# Patient Record
Sex: Male | Born: 1945 | Race: White | Hispanic: No | Marital: Married | Smoking: Never smoker
Health system: Southern US, Community
[De-identification: ages and names within clinical notes are randomized; demographics above are authoritative.]

## PROBLEM LIST (undated history)

## (undated) DIAGNOSIS — I4719 Other supraventricular tachycardia: Secondary | ICD-10-CM

## (undated) DIAGNOSIS — Z972 Presence of dental prosthetic device (complete) (partial): Secondary | ICD-10-CM

## (undated) DIAGNOSIS — Z8619 Personal history of other infectious and parasitic diseases: Secondary | ICD-10-CM

## (undated) DIAGNOSIS — I471 Supraventricular tachycardia: Secondary | ICD-10-CM

## (undated) DIAGNOSIS — R55 Syncope and collapse: Secondary | ICD-10-CM

## (undated) DIAGNOSIS — I4892 Unspecified atrial flutter: Secondary | ICD-10-CM

## (undated) DIAGNOSIS — IMO0002 Reserved for concepts with insufficient information to code with codable children: Secondary | ICD-10-CM

## (undated) DIAGNOSIS — I493 Ventricular premature depolarization: Secondary | ICD-10-CM

## (undated) DIAGNOSIS — N200 Calculus of kidney: Secondary | ICD-10-CM

## (undated) DIAGNOSIS — H5509 Other forms of nystagmus: Secondary | ICD-10-CM

## (undated) HISTORY — DX: Other supraventricular tachycardia: I47.19

## (undated) HISTORY — DX: Reserved for concepts with insufficient information to code with codable children: IMO0002

## (undated) HISTORY — DX: Unspecified atrial flutter: I48.92

## (undated) HISTORY — DX: Calculus of kidney: N20.0

## (undated) HISTORY — PX: NEPHROSTOMY: SHX1014

## (undated) HISTORY — DX: Personal history of other infectious and parasitic diseases: Z86.19

## (undated) HISTORY — DX: Syncope and collapse: R55

## (undated) HISTORY — DX: Ventricular premature depolarization: I49.3

## (undated) HISTORY — PX: CARDIAC ELECTROPHYSIOLOGY STUDY AND ABLATION: SHX1294

## (undated) HISTORY — DX: Supraventricular tachycardia: I47.1

## (undated) HISTORY — PX: SQUAMOUS CELL CARCINOMA EXCISION: SHX2433

## (undated) HISTORY — PX: HERNIA REPAIR: SHX51

## (undated) HISTORY — DX: Other forms of nystagmus: H55.09

---

## 2005-07-04 ENCOUNTER — Ambulatory Visit: Payer: Self-pay | Admitting: Family Medicine

## 2010-08-30 ENCOUNTER — Ambulatory Visit: Payer: Self-pay | Admitting: Family Medicine

## 2010-09-24 ENCOUNTER — Ambulatory Visit: Payer: Self-pay | Admitting: Unknown Physician Specialty

## 2010-12-13 ENCOUNTER — Observation Stay (HOSPITAL_COMMUNITY)
Admission: EM | Admit: 2010-12-13 | Discharge: 2010-12-15 | Disposition: A | Discharge status: Home / Self Care | Payer: Medicare Other | Source: Ambulatory Visit | Attending: Internal Medicine | Admitting: Internal Medicine

## 2010-12-13 ENCOUNTER — Emergency Department (HOSPITAL_COMMUNITY): Payer: Medicare Other

## 2010-12-13 DIAGNOSIS — R5381 Other malaise: Secondary | ICD-10-CM | POA: Insufficient documentation

## 2010-12-13 DIAGNOSIS — R61 Generalized hyperhidrosis: Secondary | ICD-10-CM | POA: Insufficient documentation

## 2010-12-13 DIAGNOSIS — I10 Essential (primary) hypertension: Secondary | ICD-10-CM | POA: Insufficient documentation

## 2010-12-13 DIAGNOSIS — R55 Syncope and collapse: Principal | ICD-10-CM | POA: Insufficient documentation

## 2010-12-13 DIAGNOSIS — R002 Palpitations: Secondary | ICD-10-CM | POA: Insufficient documentation

## 2010-12-13 DIAGNOSIS — R079 Chest pain, unspecified: Secondary | ICD-10-CM | POA: Insufficient documentation

## 2010-12-13 DIAGNOSIS — H538 Other visual disturbances: Secondary | ICD-10-CM | POA: Insufficient documentation

## 2010-12-13 LAB — COMPREHENSIVE METABOLIC PANEL
AST: 28 U/L (ref 0–37)
Albumin: 4.3 g/dL (ref 3.5–5.2)
Alkaline Phosphatase: 82 U/L (ref 39–117)
CO2: 31 mEq/L (ref 19–32)
Chloride: 105 mEq/L (ref 96–112)
Creatinine, Ser: 1.04 mg/dL (ref 0.4–1.5)
GFR calc Af Amer: >60 mL/min (ref >60)
GFR calc non Af Amer: >60 mL/min (ref >60)
Potassium: 4.7 mEq/L (ref 3.5–5.1)
Total Bilirubin: 0.4 mg/dL (ref 0.3–1.2)

## 2010-12-13 LAB — CBC
Hemoglobin: 15.1 g/dL (ref 13.0–17.0)
MCH: 30.3 pg (ref 26.0–34.0)
MCV: 92 fL (ref 78.0–100.0)
Platelets: 206 10*3/uL (ref 150–400)
RBC: 4.99 MIL/uL (ref 4.22–5.81)
WBC: 9.8 10*3/uL (ref 4.0–10.5)

## 2010-12-13 LAB — DIFFERENTIAL
Basophils Relative: 0 % (ref 0–1)
Lymphs Abs: 3.5 10*3/uL (ref 0.7–4.0)
Monocytes Relative: 9 % (ref 3–12)
Neutro Abs: 5.3 10*3/uL (ref 1.7–7.7)
Neutrophils Relative %: 54 % (ref 43–77)

## 2010-12-13 LAB — POCT CARDIAC MARKERS: Troponin i, poc: <0.05 ng/mL (ref 0.00–0.09)

## 2010-12-14 ENCOUNTER — Emergency Department (HOSPITAL_COMMUNITY): Payer: Medicare Other

## 2010-12-14 DIAGNOSIS — R55 Syncope and collapse: Secondary | ICD-10-CM

## 2010-12-14 LAB — LIPID PANEL
Total CHOL/HDL Ratio: 4.9 RATIO
VLDL: 24 mg/dL (ref 0–40)

## 2010-12-14 LAB — URINALYSIS, ROUTINE W REFLEX MICROSCOPIC
Bilirubin Urine: NEGATIVE
Hgb urine dipstick: NEGATIVE
Ketones, ur: NEGATIVE mg/dL
Nitrite: NEGATIVE
Protein, ur: NEGATIVE mg/dL
Specific Gravity, Urine: 1.012 (ref 1.005–1.030)
Urobilinogen, UA: 0.2 mg/dL (ref 0.0–1.0)

## 2010-12-14 LAB — TROPONIN I: Troponin I: <0.01 ng/mL (ref 0.00–0.06)

## 2010-12-14 LAB — CARDIAC PANEL(CRET KIN+CKTOT+MB+TROPI)
CK, MB: 2 ng/mL (ref 0.3–4.0)
CK, MB: 1.9 ng/mL (ref 0.3–4.0)
Relative Index: 1.4 (ref 0.0–2.5)
Total CK: 132 U/L (ref 7–232)
Troponin I: 0.01 ng/mL (ref 0.00–0.06)

## 2010-12-14 LAB — CK TOTAL AND CKMB (NOT AT ARMC)
CK, MB: 1.9 ng/mL (ref 0.3–4.0)
Relative Index: 1.7 (ref 0.0–2.5)
Total CK: 113 U/L (ref 7–232)

## 2010-12-14 LAB — TSH: TSH: 2.552 u[IU]/mL (ref 0.350–4.500)

## 2010-12-14 LAB — T4: T4, Total: 4.2 ug/dL — ABNORMAL LOW (ref 5.0–12.5)

## 2010-12-14 LAB — T3 UPTAKE: T3 Uptake Ratio: 37.3 % — ABNORMAL HIGH (ref 22.5–37.0)

## 2010-12-14 NOTE — H&P (Signed)
NAME:  Billy Carr, CORPENING NO.:  [id number]  MEDICAL RECORD NO.:  [id number]           PATIENT TYPE:  LOCATION:                                 FACILITY:  PHYSICIAN:  Talmage Nap, MD  DATE OF BIRTH:  Jul 18, 1946  DATE OF ADMISSION:  12/14/2010 DATE OF DISCHARGE:                             HISTORY & PHYSICAL   PRIMARY CARE PHYSICIAN:  Assigned.  History obtainable from the patient and the patient's spouse.  CHIEF COMPLAINT: "I Almost passed out in church on Sunday".  The patient is a 65 year old Caucasian male with prior history of shingles and kidney stone status post nephrostomy presenting to the emergency room with history of almost passing out in church on Sunday. The patient claimed that his problems date back only to over a month ago when he complained to his PCP that he was having some precordial discomfort.  There was, however, no associated diaphoresis.  No nausea, no vomiting, no fever, no chills, no rigor, no radiation of the precordial discomfort.  No PND or orthopnea.  The patient saw his primary care doctor and BP check was found to be elevated 156/90 from his normal blood pressure, which is usually 115/85.  The patient subsequently had echocardiogram done.  He also complained about on and off blurry vision and a followup ENT evaluation was again unremarkable. However, on Sunday while the patient was singing in church, he felt very sweaty, diaphoretic, and feeling of being unwell and was almost about to pass out and subsequently had to sit down.  He denied any history of chest pain at that time.  He denied any nausea or vomiting.  Symptom was said to have been transient and subsequently resolved.  The patient, however, came to the hospital on the advice of his PCP to be fully evaluated.  At the time the patient was seen by me, he presently denies any symptoms.  No chest pain or shortness of breath and after full evaluation, he was advised to  be admitted for further workup.  PAST MEDICAL HISTORY:  Positive for kidney stones and shingles.  PAST SURGICAL HISTORY: 1. Left kidney stone status post open nephrostomy. 2. Bilateral hernia repair.  PREADMISSION MEDICATIONS:  Aspirin, dose unknown.  ALLERGIES:  He has no known allergies.  SOCIAL HISTORY:  Negative for alcohol or tobacco use.  He works as an Theatre stage manager.  FAMILY HISTORY:  Negative for any coronary artery disease.  REVIEW OF SYSTEMS:  The patient denies any headaches.  No blurred vision.  No nausea or vomiting.  No fever.  No chills or rigor.  No chest pain or shortness of breath.  No abdominal discomfort.  No diarrhea or hematochezia.  No dysuria or hematuria.  No swelling of the lower extremities.  No intolerance to heat or cold.  No neuropsychiatric disorder.  PHYSICAL EXAMINATION:  GENERAL:  Very pleasant elderly man not in any obvious respiratory distress, well hydrated. VITAL SIGNS:  Blood pressure is 111/60, pulse 60, respiratory rate 14, temperature is 97.9. HEENT:  Pupils are reactive to light and extraocular muscles are intact. NECK:  No jugular venous distention.  No carotid bruit.  No lymphadenopathy. CHEST:  Clear to auscultation. CARDIAC:  Heart sounds are 1 and 2. ABDOMEN:  Soft and nontender.  Liver, spleen and kidneys are not palpable.  Bowel sounds are positive. EXTREMITIES:  No pedal edema. NEUROLOGIC:  Nonfocal. MUSCULOSKELETAL SYSTEM:  Unremarkable. SKIN:  Normal turgor.  LABORATORY DATA:  Initial chemistry showed sodium of 142, potassium of 4.7, chloride of 105 with a bicarb of 51, glucose is 91, BUN is 16, creatinine is 1.04.  LFT normal.  Hematology indices showed WBC of 9.2, hemoglobin of 15.1, hematocrit of 45.9, MCV of 92.0 with a platelet count of 206, normal differential.  First set of cardiac markers troponin-I less than 0.05.  EKG showed normal sinus rhythm with a rate of 60.  No ST-wave changes noted.  Chest x-ray,  the study done include chest x-ray which was essentially normal.  IMPRESSION: 1. Near syncope. 2. History of shingles.  PLAN:  Admit the patient to telemetry.  The patient will be on aspirin 325 mg p.o. daily and he will also be given Protonix 40 mg p.o. daily for GI prophylaxis and Lovenox 40 mg subcu q.24 for DVT prophylaxis. Further labs to be ordered on this patient will include cardiac enzymes q.6 x3, lipid panel as well as thyroid panel which include TSH, T3, and T4.  Imaging studies to be done will include CT of the brain without contrast, carotid duplex, and 2-D echo.  The patient will be reevaluated with lab results.     Talmage Nap, MD     CN/MEDQ  D:  12/14/2010  T:  12/14/2010  Job:  [id number]  Electronically Signed by Talmage Nap  on 12/14/2010 12:08:00 PM

## 2010-12-15 ENCOUNTER — Telehealth: Payer: Self-pay | Admitting: Cardiovascular Disease

## 2010-12-15 ENCOUNTER — Observation Stay (HOSPITAL_COMMUNITY): Payer: Medicare Other

## 2010-12-15 LAB — CBC
HCT: 42.8 % (ref 39.0–52.0)
MCHC: 32 g/dL (ref 30.0–36.0)
MCV: 90.3 fL (ref 78.0–100.0)

## 2010-12-15 LAB — BASIC METABOLIC PANEL
BUN: 17 mg/dL (ref 6–23)
Creatinine, Ser: 1.11 mg/dL (ref 0.4–1.5)
GFR calc non Af Amer: >60 mL/min (ref >60)
Glucose, Bld: 101 mg/dL — ABNORMAL HIGH (ref 70–99)
Potassium: 4.3 mEq/L (ref 3.5–5.1)

## 2010-12-15 LAB — GLUCOSE, CAPILLARY: Glucose-Capillary: 94 mg/dL (ref 70–99)

## 2010-12-28 NOTE — Progress Notes (Signed)
Summary: Needs Exercise Myoview and Event Monitor set up  Phone Note Other Incoming   Caller: Ward Givens, PA Summary of Call: Pt needs a 5 week post hospital appt with Dr Graciela Husbands, appt made for 01/17/11, he will make pt aware of appt date and time.  Pt also needs an exercise Myoview at [facility] for syncope, pt weighs 110.9kg, and needs to be set up with a [employer] monitor ASAP, 30 day event monitor.  Unsure of pt's insurance please call pt to obtain [phone].  Pt is being sent home for [facility] today.  Initial call taken by: Cloyde Reams RN,  December 15, 2010 2:29 PM  Follow-up for Phone Call        30 Day [employer] event monitor ordered 12/16/10. Scheduled gated myoview for 12/29/10 @ [facility]. Called pt with no response, need to obtain his medlist and give appt date/time and instructions for myoview. Follow-up by: Lanny Hurst RN,  December 17, 2010 4:34 PM  Additional Follow-up for Phone Call Additional follow up Details #1::        Spoke to pt, notified him of gated myoview scheduled  @ [facility] for 12/29/10 @ 0900 and to arrive at 0730. Pt does not take any medications. Gave pt instructions for myoview. Told pt event monitor should be en route to his house. Additional Follow-up by: Lanny Hurst RN,  December 20, 2010 12:06 PM  New Problems: SYNCOPE AND COLLAPSE (ICD-780.2)   New Problems: SYNCOPE AND COLLAPSE (ICD-780.2)

## 2010-12-29 ENCOUNTER — Encounter: Payer: Self-pay | Admitting: Cardiovascular Disease

## 2010-12-29 ENCOUNTER — Ambulatory Visit: Payer: Self-pay | Admitting: Cardiovascular Disease

## 2010-12-29 DIAGNOSIS — R55 Syncope and collapse: Secondary | ICD-10-CM

## 2010-12-29 NOTE — Discharge Summary (Signed)
  NAME:  Billy Carr, MROZEK NO.:  [id number]  MEDICAL RECORD NO.:  [id number]           PATIENT TYPE:  LOCATION:                                 FACILITY:  PHYSICIAN:  Thad Ranger, MD       DATE OF BIRTH:  12/03/1945  DATE OF ADMISSION: DATE OF DISCHARGE:                              DISCHARGE SUMMARY   DISCHARGE DIAGNOSES: 1. Recurrent syncope. 2. History of shingles. 3. Palpitations, improved. 4. Hypertension.  CONSULTATIONS:  Cardiology, Dr. Rollene Rotunda.  MEDICATIONS: 1. Aspirin 81 mg p.o. daily. 2. Multivitamin 1 tablet p.o. daily. 3. Vitamin D2 - 50,000 units capsule weekly.  HISTORY OF PRESENT ILLNESS:  Billy Carr is a 65 year old male with history of shingles and kidney stone status post nephrostomy presented to the emergency room with almost passing out in the church on Sunday, 2 days go.  The patient stated that his problem started back only over a month ago, and then he complained to his PCP that he was having some precordial discomfort but no diaphoresis, no nausea, vomiting, fever, chills, or any rigors.  No radiation of the precordial discomfort, no PND or orthopnea.  For details, please refer to admission note dictated by Dr. Talmage Nap on December 14, 2010.  RADIOLOGICAL DATA:  Chest x-ray on December 13, 2010, no acute abnormality. CT head without contrast unremarkable. MRI brain and MRA negative.  MRI did show mild chronic changes in the white matter which likely is due to his chronic ischemia.  No acute abnormality. Echo from the patient's previous cardiologist in [place] showed EF of 50%.  BRIEF HOSPITALIZATION COURSE:  Billy Carr is a 65 year old male who was admitted with recurrent syncopes. 1. Recurrent syncope.  The patient was admitted to the tele monitored     unit.  He did not have any significant arrhythmias while inpatient.     He was started on the aspirin and MRI/MRA done as a part of     recurrent syncope  workup was essentially negative.  Cardiology was     consulted, and the patient was graciously seen by Dr. Antoine Poche.  A     2-D echo was reviewed from his previous physician in [place]     which was essentially normal.  Per Cardiology recommendation, the     patient will be set up for outpatient exercise treadmill test as     well as the event recorder for 30 days.  The patient remained     stable for the hospitalization and will follow up with Dr. Graciela Husbands as     an outpatient which was all set up for the patient prior to the     discharge.  DISCHARGE DIET:  Heart-healthy diet.  Discharge followup with PCP in [place] within 2 weeks and Dr. Graciela Husbands, the appointment will be set up for the patient.     Thad Ranger, MD     RR/MEDQ  D:  12/15/2010  T:  12/16/2010  Job:  [id number]  Electronically Signed by Tamotsu Wiederholt  on 12/29/2010 07:45:38 AM

## 2010-12-30 NOTE — Consult Note (Signed)
NAME:  Billy Carr, Billy Carr NO.:  [id number]  MEDICAL RECORD NO.:  [id number]           PATIENT TYPE:  I  LOCATION:  3705                         FACILITY:  [facility]  PHYSICIAN:  Rollene Rotunda, MD, FACCDATE OF BIRTH:  11/11/1945  DATE OF CONSULTATION:  12/14/2010 DATE OF DISCHARGE:                                CONSULTATION   PRIMARY CARE PHYSICIAN:  Dr. Thayer Ohm.  CARDIOLOGIST:  None.  REASON FOR PRESENTATION:  Evaluate the patient with near syncope.  HISTORY OF PRESENT ILLNESS:  The patient is a very pleasant 65 year old gentleman with no prior cardiac history but a recent workup for some "strong heartbeats."  He says that he has noticed strong heart beats intermittently over about the last month.  He has at times awoken from his sleep because of these.  He cannot bring these on.  He does not describe tachycardic rates.  He had not been having any presyncope or syncope associated with these.  He did see his primary care doctor and was referred to cardiologist.  He had an echocardiogram in [place] which he reports as normal.  He says he wore a monitor which I suspect was a Holter.  He was told that there were some rare tachy palpitations and apparently some isolated bradycardic rates but nothing that required treatment or was felt to be a culprit.  On Sunday while at church, he became warm while standing.  He felt "wobbly".  He felt flushed and apparently became red in the face.  He needed to sit down.  He recovered but then had another less severe but similar episode within a short timeframe.  He never lost consciousness.  He did not feel his heart racing or skipping.  On Monday, he just did not feel well.  He was noted to have an elevated blood pressure when he checked it, 156/90.  This is the second such reading recently.  Because of this constellation of symptoms, he presented to the emergency room.  Here, thus far, enzymes have been negative.  Telemetry has  demonstrated sinus rhythm and some bradycardia, but otherwise was unremarkable.  We are consulted to evaluate this.  Of note, the patient is active.  He has been training for a 10K.  He runs 3 miles at a time and actually apparently did 7 miles recently.  He does not get any chest pressure, neck or arm discomfort.  He does not have any shortness of breath, PND, or orthopnea.  He has had no cough or edema.  PAST MEDICAL HISTORY:  Nephrolithiasis, shingles, psoriasis.  PAST SURGICAL HISTORY:  Nephrostomy, bilateral inguinal herniorrhaphy.  ALLERGIES:  None.  MEDICATIONS:  Aspirin.  SOCIAL HISTORY:  The patient is married.  He has 3 children.  He works in Community education officer.  He has never really smoked cigarettes.  FAMILY HISTORY:  Contributory for a sister having a pacemaker and a valve replacement at the same time in her 55s.  There is no early heart disease, heart failure, or sudden cardiac death.  REVIEW OF SYSTEMS:  Positive for nystagmus.  Otherwise, as stated in the HPI, negative for all other systems.  PHYSICAL EXAMINATION:  GENERAL:  The patient is pleasant and in no distress. VITAL SIGNS:  Blood pressure 122/81, heart rate 59 and regular, afebrile, respiratory rate 18, 93% saturation on room air. HEENT:  Eyelids are unremarkable.  Pupils equal, round, and reactive to light.  Fundi not visualized.  Oral mucosa unremarkable. NECK:  No jugular venous distention at 45 degrees.  Carotid upstroke brisk and symmetrical.  No bruits, no thyromegaly. LYMPHATICS:  No cervical, axillary, inguinal adenopathy. LUNGS:  Clear to auscultation bilaterally. BACK:  No costovertebral angle tenderness. CHEST:  Unremarkable. HEART:  PMI not displaced or sustained, S1 and S2 within normal limits, no S3, no S4, no clicks, no rubs, no murmurs. ABDOMEN:  Flat, positive bowel sounds, normal frequency and pitch, no bruits, no rebound, no guarding, no midline pulsatile mass, no hepatomegaly, no  splenomegaly. SKIN:  No rashes, no nodules. EXTREMITIES:  2+ pulses throughout, no edema, no cyanosis, no clubbing. NEUROLOGIC:  Oriented to person, place, and time.  Cranial nerves II-XII grossly intact, motor grossly intact.  EKG, sinus rhythm, rate 60, axis within normal limits, intervals within normal limits, no acute ST-T wave changes.  TSH 2.552, CK-MB negative x2, LDL 122.  Head CT, negative noncontrast.  Chest x-ray, no acute disease.  ASSESSMENT AND PLAN: 1. Near syncope.  The patient had near syncope but did not actually     lose consciousness.  I suspect that this was vagal.  He has had a     negative workup to date.  We do not need to repeat an     echocardiogram while he is here, but I will get the results from     his physician in [place] to review these.  I would also like to     look at all of the strips on his monitor to make sure there were no     arrhythmias.  I will also order an outpatient exercise treadmill     test.  If this is negative and he has no recurrent symptoms, then     no further cardiac workup would be necessary. 2. Palpitations.  He did have these while wearing a monitor.  There     were no apparent significant dysrhythmias and I will review this.     We will then consider symptomatic treatment if necessary. 3. Hypertension.  His blood pressure has been slightly elevated, but     not since he has been here.  I have advised him to get a blood     pressure cuff, keep a blood pressure diary at home.    Rollene Rotunda, MD, [facility]    JH/MEDQ  D:  12/14/2010  T:  12/15/2010  Job:  [id number]  cc:   Dr. Thayer Ohm.  Electronically Signed by Rollene Rotunda MD [facility] on 12/30/2010 07:58:38 PM

## 2010-12-31 ENCOUNTER — Telehealth: Payer: Self-pay | Admitting: Cardiovascular Disease

## 2011-01-04 NOTE — Progress Notes (Signed)
  Phone Note Other Incoming   Caller: Patient Caller: Dr. Mariah Milling Details for Reason: Myoview Summary of Call: Per Dr. Mariah Milling let patient know Myoview from [facility] looks okay.   Follow-up for Phone Call        notified patient Myoview from [facility] looks okay.  Told patient to continue with follow up appt. with Dr. Graciela Husbands on January 17, 2011. Follow-up by: Bishop Dublin, CMA,  December 31, 2010 10:04 AM

## 2011-01-17 ENCOUNTER — Encounter: Payer: Self-pay | Admitting: Internal Medicine

## 2011-01-17 ENCOUNTER — Ambulatory Visit (INDEPENDENT_AMBULATORY_CARE_PROVIDER_SITE_OTHER): Payer: Medicare Other | Admitting: Internal Medicine

## 2011-01-17 DIAGNOSIS — I471 Supraventricular tachycardia: Secondary | ICD-10-CM | POA: Insufficient documentation

## 2011-01-17 DIAGNOSIS — H5509 Other forms of nystagmus: Secondary | ICD-10-CM | POA: Insufficient documentation

## 2011-01-17 DIAGNOSIS — I498 Other specified cardiac arrhythmias: Secondary | ICD-10-CM

## 2011-01-17 DIAGNOSIS — R55 Syncope and collapse: Secondary | ICD-10-CM | POA: Insufficient documentation

## 2011-01-17 NOTE — Patient Instructions (Signed)
Your physician recommends that you schedule a follow-up appointment in: 3-4 months. 

## 2011-01-17 NOTE — Progress Notes (Signed)
  HPI  Billy Carr is a 65 y.o. male Whom I saw in the hospital at [facility] at the end of February for an episode of syncope. This occurred in hospital and was associated with a prodrome consistent with neurally mediated syncope. He has a Foley had episodes of lightheadedness associated with prolonged standing. I should note that the hospitalized episode was associated vertical nystagmus.  2 essentially discharged with an event recorder which has been negative to date and a stress test which was done by Dr. Knute Neu and demonstrated normal left ventricular function 90% achieved maximal heart rate and no ischemia  Past Medical History  Diagnosis Date  . Nephrolithiasis   . History of shingles     Past Surgical History  Procedure Date  . Nephrostomy     open, left kidney stone  . Hernia repair     bilateral    Current Outpatient Prescriptions  Medication Sig Dispense Refill  . aspirin 81 MG EC tablet Take 81 mg by mouth daily.        . ergocalciferol (VITAMIN D2) 50000 UNITS capsule Take 50,000 Units by mouth once a week.        . Multiple Vitamin (MULTIVITAMIN) tablet Take 1 tablet by mouth daily.          No Known Allergies  Review of Systems negative except from HPI and PMH  Physical Exam Well developed and well nourished Older Caucasian male appearing his stated age in no acute distress HENT normal E scleral and icterus clear Neck Supple JVP flat; carotids brisk and full Clear to ausculation Regular rate and rhythm, no murmurs gallops or rub Soft with active bowel sounds No clubbing cyanosis and edema Alert and oriented, grossly normal motor and sensory function;No evidence of nystagmus Skin Warm and Dry  ECG  Sinus rhythm at 63 Intervals 0.15/2007/0.39 Axis is -55 Assessment and  Plan

## 2011-01-17 NOTE — Assessment & Plan Note (Signed)
No clinical recurrences; his event recorder warned thus far has been unrevealing

## 2011-01-17 NOTE — Assessment & Plan Note (Signed)
The patient has had no recurrent syncope. He has had some orthostatic intolerance. We interviewed maneuvers to try to mitigate this. Also advised and as the importance of hydration especially in the summer months.

## 2011-10-27 ENCOUNTER — Telehealth: Payer: Self-pay | Admitting: *Deleted

## 2011-10-27 ENCOUNTER — Encounter: Payer: Self-pay | Admitting: *Deleted

## 2011-10-27 ENCOUNTER — Encounter: Payer: Self-pay | Admitting: Internal Medicine

## 2011-10-27 ENCOUNTER — Ambulatory Visit (INDEPENDENT_AMBULATORY_CARE_PROVIDER_SITE_OTHER): Payer: Medicare Other | Admitting: Internal Medicine

## 2011-10-27 ENCOUNTER — Ambulatory Visit: Payer: Medicare Other | Admitting: *Deleted

## 2011-10-27 VITALS — BP 130/89 | HR 149 | Ht 72.0 in | Wt 244.0 lb

## 2011-10-27 VITALS — BP 130/89 | HR 149 | Ht 72.0 in | Wt 224.0 lb

## 2011-10-27 DIAGNOSIS — I4892 Unspecified atrial flutter: Secondary | ICD-10-CM | POA: Insufficient documentation

## 2011-10-27 DIAGNOSIS — R002 Palpitations: Secondary | ICD-10-CM

## 2011-10-27 HISTORY — DX: Unspecified atrial flutter: I48.92

## 2011-10-27 NOTE — Telephone Encounter (Signed)
Pt called with c/o past 2 nights unable to get to sleep due to "fluttering sensation in chest," difficult for pt to describe, but this has also woken him up at night x 2, with last night diaphoretic as well. Pt is not diabetic. He denies related SOB or chest pain, but does report "light chest pressure that comes and goes." When he takes a deep breath, "feel the fluttering more intensely." Pt last seen by Dr. Graciela Husbands 01/2011 after ED visit to [facility], he was dx with syncope and atrial tachycardia, 30 day event monitor was unrevealing. Pt states BP usually runs 110-115/70s, unknown HR. He is going to buy a BP cuff today and monitor. He takes no cardiac meds. I have scheduled pt to see Dr. Mariah Milling in clinic in 2 weeks (first available); advised he check BP/HR at next episode and call the office; otherwise if sx become more intense or frequent may come in for nurse visit or see urgent care if after hours. Will forward to Dr. Graciela Husbands for any further rec's in the meantime.

## 2011-10-27 NOTE — Assessment & Plan Note (Signed)
The patient has atrial flutter with 2:1 conduction.  We discussed his thromboembolic risk as well as the potential for risk reduction with catheter ablation as well as a likely diminution in symptoms.  I discussed the fact that atrial flutter is frequently recurring; furthermore that it is not infrequently associated with atrial fibrillation post ablation.  He would like to discuss it with his wife.  We also discussed procedure risks including but not limited to death perforation and heart block requiring pacemaker implantation.  hromboembolic risk

## 2011-10-27 NOTE — Progress Notes (Signed)
See phone note from today; pt called back stating after getting a BP cuff his HR showed 154. Pt in office now, EKG shows possible SVT vs Atrial flutter HR 149. Pt is asymptomatic, other than "just not feeling like myself." Denies CP or SOB. Will discuss EKG with Dr. Graciela Husbands.

## 2011-10-27 NOTE — Progress Notes (Signed)
  HPI  Billy Carr is a 66 y.o. male Seen as an add-on today as he came in because of tachypalpitations. It turns out he was in atrial flutter at 20 and this terminated spontaneously after 2 or 3 days at the time of his ECG.  His thrombo-embolic risk profile is notable only age over 79 for a chadsvasc score of 1   Past Medical History  Diagnosis Date  . Nephrolithiasis   . History of shingles   . Syncope     Neurally mediated  . Atrial tachycardia   . Vertical nystagmus     Past Surgical History  Procedure Date  . Nephrostomy     open, left kidney stone  . Hernia repair     bilateral    Current Outpatient Prescriptions  Medication Sig Dispense Refill  . aspirin 81 MG EC tablet Take 81 mg by mouth daily.        . ergocalciferol (VITAMIN D2) 50000 UNITS capsule Take 50,000 Units by mouth once a week.        . Multiple Vitamin (MULTIVITAMIN) tablet Take 1 tablet by mouth daily.          No Known Allergies  Review of Systems negative except from HPI and PMH  Physical Exam BP 130/89  Pulse 149  Ht 6' (1.829 m)  Wt 224 lb (101.606 kg)  BMI 30.38 kg/m2 Well developed and well nourished in no acute distress HENT normal E scleral and icterus clear Neck Supple JVP flat; carotids brisk and full Clear to ausculation Regular rate and rhythm, no murmurs gallops or rub Soft with active bowel sounds No clubbing cyanosis none Edema Alert and oriented, grossly normal motor and sensory function Skin Warm and Dry  ECG 1 demonstrates atrial flutter-typical with 21 AV conduction Echocardiogram #2 demonstrates sinus rhythm with normal intervals  Assessment and  Plan

## 2011-10-27 NOTE — Telephone Encounter (Signed)
Pt called back stating he bought a BP cuff and his HR is showing 154. I advised pt he come to office now for EKG.

## 2011-11-10 ENCOUNTER — Ambulatory Visit: Payer: Medicare Other | Admitting: Cardiovascular Disease

## 2011-11-23 ENCOUNTER — Ambulatory Visit (INDEPENDENT_AMBULATORY_CARE_PROVIDER_SITE_OTHER): Payer: Medicare Other | Admitting: Internal Medicine

## 2011-11-23 ENCOUNTER — Encounter: Payer: Self-pay | Admitting: Internal Medicine

## 2011-11-23 VITALS — BP 120/80 | HR 73 | Ht 72.0 in | Wt 247.0 lb

## 2011-11-23 DIAGNOSIS — I4892 Unspecified atrial flutter: Secondary | ICD-10-CM

## 2011-11-23 NOTE — Progress Notes (Signed)
Addended by: Festus Aloe on: 11/23/2011 05:23 PM   Modules accepted: Orders

## 2011-11-23 NOTE — Assessment & Plan Note (Signed)
The patient has atrial flutter. I discussed extensively with his wife and him strategies including catheter ablation versus doing nothing. His CHADS-VASc score is sufficiently low that he is no longer taking aspirin. He is having recurrent tachypalpitations on a nightly basis. I do not think these are atrial flutter. In the event that there atrial fibrillation however, I would not pursue catheter ablation of his atrial flutter isthmus. To that end, we'll undertake an event recorder to clarify the mechanism of the tachycardia palpitations

## 2011-11-23 NOTE — Progress Notes (Signed)
skf  HPI  Billy Carr is a 66 y.o. male seen in followup for syncope he also has a history of orthostatic intolerance cells mild atrial tachycardia either of which were clearly related to his syncope..    Last year  an event recorder was negative date and a stress test which was done by Dr. Knute Neu and demonstrated normal left ventricular function 90% achieved maximal heart rate and no ischemia  He was seen last month with tachycardia palpitations and was found to be in atrial flutter terminated spontaneously couple of days afterwards. We discussed extensively catheter ablation even though his CHADS-VASc score was only 1. He comes in today with his wife to review options.  He knows that he has daily tachypalpitations lasting mostly minutes often most notable at night  Past Medical History  Diagnosis Date  . Nephrolithiasis   . History of shingles   . Syncope     Neurally mediated  . Atrial tachycardia   . Vertical nystagmus     Past Surgical History  Procedure Date  . Nephrostomy     open, left kidney stone  . Hernia repair     bilateral    Current Outpatient Prescriptions  Medication Sig Dispense Refill  . aspirin 81 MG EC tablet Take 81 mg by mouth daily.        . ergocalciferol (VITAMIN D2) 50000 UNITS capsule Take 50,000 Units by mouth once a week.        . Multiple Vitamin (MULTIVITAMIN) tablet Take 1 tablet by mouth daily.          No Known Allergies  Review of Systems negative except from HPI and PMH  Physical Exam Well developed and well nourished Older Caucasian male appearing his stated age in no acute distress HENT normal Neck Supple JVP flat; carotids brisk and full Clear to ausculation Regular rate and rhythm, no murmurs gallops or rub Soft with active bowel sounds No clubbing cyanosis and edema Alert and oriented, grossly normal motor and sensory function Skin Warm and Dry  ECG  Sinus rhythm at 71Intervals 0.15/.08/0.39 Axis is -52 Assessment and   Plan

## 2011-11-23 NOTE — Patient Instructions (Signed)
Need to have an event monitor placed.  You will receive the monitor in the mail. We will contact you with the results.

## 2011-12-03 ENCOUNTER — Telehealth: Payer: Self-pay | Admitting: Physician Assistant

## 2011-12-03 NOTE — Telephone Encounter (Signed)
[facility] called to report 90 seconds of atrial fibrillation rates 105-160, self-terminated earlier today. Strips will be sent to our office for Dr. Odessa Fleming review. See OV note re: hx of atrial flutter.  Dakotah Orrego PA-C

## 2012-04-17 ENCOUNTER — Ambulatory Visit (INDEPENDENT_AMBULATORY_CARE_PROVIDER_SITE_OTHER): Payer: Medicare Other | Admitting: Internal Medicine

## 2012-04-17 ENCOUNTER — Encounter: Payer: Self-pay | Admitting: Internal Medicine

## 2012-04-17 VITALS — BP 100/60 | HR 80 | Ht 72.0 in | Wt 221.2 lb

## 2012-04-17 DIAGNOSIS — I493 Ventricular premature depolarization: Secondary | ICD-10-CM

## 2012-04-17 DIAGNOSIS — I4892 Unspecified atrial flutter: Secondary | ICD-10-CM

## 2012-04-17 DIAGNOSIS — I4949 Other premature depolarization: Secondary | ICD-10-CM

## 2012-04-17 DIAGNOSIS — R002 Palpitations: Secondary | ICD-10-CM

## 2012-04-17 NOTE — Progress Notes (Signed)
  HPI  Billy Carr is a 66 y.o. male seen in followup for syncope;  he also has a history of orthostatic intolerance and atrial tachycardia neither of which were clearly related to his syncope..   Last year an event recorder was negative; a stress test which was done by Dr. Knute Neu and demonstrated normal left ventricular function with 90% achieved maximal heart rate and no ischemia  He was seen Feb 2013 with tachy  palpitations and was found to be in atrial flutter which terminated spontaneously couple of days afterwards.  At that time there was a discussion regarding possible ablation; but he was having nocturnal tachy palpitations and an event recorder was ordered>> atrial fib with spells occurring in clusters the longest of which was about an hour and a half with an average heart rate of about 110 at peak heart rates in the 160s.  Most of his symptoms are most of the morning at night. He occasionally waking him at night. I should note that he is not having palpitations today (see below-PVCs) if    Past Medical History  Diagnosis Date  . Nephrolithiasis   . History of shingles   . Syncope     Neurally mediated  . Atrial tachycardia   . Vertical nystagmus   . Atrial flutter 10/27/2011    Past Surgical History  Procedure Date  . Nephrostomy     open, left kidney stone  . Hernia repair     bilateral    Current Outpatient Prescriptions  Medication Sig Dispense Refill  . Multiple Vitamin (MULTIVITAMIN) tablet Take 1 tablet by mouth daily.          No Known Allergies  Review of Systems negative except from HPI and PMH  Physical Exam BP 100/60  Pulse 80  Ht 6' (1.829 m)  Wt 221 lb 4 oz (100.358 kg)  BMI 30.01 kg/m2 Well developed and well nourished in no acute distress HENT normal E scleral and icterus clear Neck Supple JVP flat; carotids brisk and full Clear to ausculation Regular rate and rhythm, no murmurs gallops or rub Soft with active bowel sounds No clubbing  cyanosis none Edema Alert and oriented, grossly normal motor and sensory function Skin Warm and Dry    Assessment and  Plan

## 2012-04-17 NOTE — Assessment & Plan Note (Signed)
He has recurrent symptoms of tachycardia palpitations frequently but not always at night. In fact event recorder identifies multiple episodes occurring during the day. Hence, I think it is unlikely that this is vagal  '  . Given the discombobulating of symptoms associated typically with going to sleep, we will give him a prescription for Inderal take an as-needed basis. He is not inclined currently take antiarrhythmic therapy given the relative infrequency and brief duration of his symptoms and

## 2012-04-17 NOTE — Patient Instructions (Addendum)
   Start inderal 10 mg as needed for palpitations.  Follow up 6 months with Dr. Graciela Husbands.

## 2012-04-18 ENCOUNTER — Telehealth: Payer: Self-pay

## 2012-04-18 ENCOUNTER — Telehealth: Payer: Self-pay | Admitting: Internal Medicine

## 2012-04-18 ENCOUNTER — Other Ambulatory Visit: Payer: Self-pay | Admitting: Internal Medicine

## 2012-04-18 ENCOUNTER — Other Ambulatory Visit: Payer: Self-pay

## 2012-04-18 DIAGNOSIS — I4892 Unspecified atrial flutter: Secondary | ICD-10-CM

## 2012-04-18 DIAGNOSIS — R002 Palpitations: Secondary | ICD-10-CM

## 2012-04-18 MED ORDER — PROPRANOLOL HCL 10 MG PO TABS: 10.0000 mg | ORAL_TABLET | Freq: Three times a day (TID) | ORAL | Status: DC | PRN

## 2012-04-18 NOTE — Telephone Encounter (Signed)
Refill sent for Inderal 10 mg per Dr. Graciela Husbands.

## 2012-04-18 NOTE — Telephone Encounter (Signed)
LHB patient. Will forward to [place].

## 2012-04-18 NOTE — Telephone Encounter (Signed)
Pt was to get Inderal called in to [facility] in [place] and they dont have it can you call pt and let him know when it's called in

## 2012-04-18 NOTE — Telephone Encounter (Signed)
LMOM to have patient check with [facility] pharmacy for the new Rx.

## 2012-04-18 NOTE — Telephone Encounter (Signed)
New Rx sent to [facility] for Inderal 10 mg take as needed for palpitations per Dr. Graciela Husbands.

## 2012-04-22 ENCOUNTER — Emergency Department: Payer: Self-pay | Admitting: *Deleted

## 2012-04-22 LAB — CBC
HCT: 45.3 % (ref 40.0–52.0)
HGB: 14.7 g/dL (ref 13.0–18.0)
MCH: 30.1 pg (ref 26.0–34.0)
MCHC: 32.5 g/dL (ref 32.0–36.0)
WBC: 8.2 10*3/uL (ref 3.8–10.6)

## 2012-04-22 LAB — BASIC METABOLIC PANEL
Anion Gap: 5 — ABNORMAL LOW (ref 7–16)
BUN: 19 mg/dL — ABNORMAL HIGH (ref 7–18)
Calcium, Total: 8.9 mg/dL (ref 8.5–10.1)
EGFR (African American): >60
EGFR (Non-African Amer.): >60
Glucose: 103 mg/dL — ABNORMAL HIGH (ref 65–99)
Osmolality: 284 (ref 275–301)
Potassium: 3.8 mmol/L (ref 3.5–5.1)

## 2012-04-22 LAB — TROPONIN I: Troponin-I: 0.04 ng/mL

## 2012-04-23 ENCOUNTER — Ambulatory Visit (INDEPENDENT_AMBULATORY_CARE_PROVIDER_SITE_OTHER): Payer: Medicare Other | Admitting: Cardiovascular Disease

## 2012-04-23 ENCOUNTER — Encounter: Payer: Self-pay | Admitting: Cardiovascular Disease

## 2012-04-23 VITALS — BP 108/64 | HR 59 | Ht 72.0 in | Wt 221.2 lb

## 2012-04-23 DIAGNOSIS — I4949 Other premature depolarization: Secondary | ICD-10-CM

## 2012-04-23 DIAGNOSIS — I4892 Unspecified atrial flutter: Secondary | ICD-10-CM

## 2012-04-23 DIAGNOSIS — I493 Ventricular premature depolarization: Secondary | ICD-10-CM

## 2012-04-23 NOTE — Patient Instructions (Addendum)
Your physician has requested that you have an echocardiogram. Echocardiography is a painless test that uses sound waves to create images of your heart. It provides your doctor with information about the size and shape of your heart and how well your heart's chambers and valves are working. This procedure takes approximately one hour. There are no restrictions for this procedure.  Start Aspirin 325 mg once daily.   I will discuss with Dr. Graciela Husbands and let you know.

## 2012-04-23 NOTE — Assessment & Plan Note (Signed)
The patient seems to be clearly having more frequent episodes of tachycardia and palpitations based on his description. He appears to be highly symptomatic during these episodes. His baseline heart rate and blood pressure are low and thus it would be difficult to give him a maintenance dose of a beta blocker or calcium channel blocker. I think either need an antiarrhythmic medication or proceeding with an ablation procedure. He has not had any recent echocardiogram and thus I will request one to be done this week to evaluate his LV systolic function and atrial size. He is to continue as needed Inderal. I will be discussing the case with Dr. Graciela Husbands. A class IC antiarrhythmic medication might be reasonable if his echocardiogram does not show any significant structural abnormalities.

## 2012-04-23 NOTE — Progress Notes (Signed)
HPI  Billy Carr is a 66 y.o. male who was added to my schedule today after an emergency room visit at [facility]. He was seen by Dr. Graciela Husbands last week. He has a history of syncope; he also has a history of orthostatic intolerance and atrial tachycardia neither of which were clearly related to his syncope..  Last year an event recorder was negative; a stress test which was done by Dr. Knute Neu and demonstrated normal left ventricular function with 90% achieved maximal heart rate and no ischemia  He was seen Feb 2013 with tachy palpitations and was found to be in atrial flutter which terminated spontaneously couple of days afterwards.  At that time there was a discussion regarding possible ablation; but he was having nocturnal tachy palpitations and an event recorder was ordered>> atrial fib with spells occurring in clusters the longest of which was about an hour and a half with an average heart rate of about 110 at peak heart rates in the 160s.  During last visit, he was started on Inderal to be used as needed for palpitations and tachycardia. On Wednesday, he had frequent episodes with significant relief with Inderal.  He woke up on Sunday morning at 3:30 with palpitations associated with some chest discomfort and dyspnea. He felt his heart was racing. He took one dose of Inderal and went to the emergency room. By the time he arrived to the emergency room he was no longer tachycardic. He asked he had sinus bradycardia with a heart rate of 55 beats per minute. His labs were unremarkable and cardiac enzymes were negative. He was discharged home with a close followup.  No Known Allergies   Current Outpatient Prescriptions on File Prior to Visit  Medication Sig Dispense Refill  . Multiple Vitamin (MULTIVITAMIN) tablet Take 1 tablet by mouth daily.        . propranolol (INDERAL) 10 MG tablet Take 1 tablet (10 mg total) by mouth 3 (three) times daily as needed.  30 tablet  2     Past Medical History    Diagnosis Date  . Nephrolithiasis   . History of shingles   . Syncope     Neurally mediated  . Atrial tachycardia   . Vertical nystagmus   . Atrial flutter/fib 10/27/2011  . PVC (premature ventricular contraction)     asymptomatic     Past Surgical History  Procedure Date  . Nephrostomy     open, left kidney stone  . Hernia repair     bilateral     Family History  Problem Relation Age of Onset  . Heart failure Mother      History   Social History  . Marital Status: Married    Spouse Name: N/A    Number of Children: N/A  . Years of Education: N/A   Occupational History  . Not on file.   Social History Main Topics  . Smoking status: Never Smoker   . Smokeless tobacco: Never Used  . Alcohol Use: No  . Drug Use: No  . Sexually Active:    Other Topics Concern  . Not on file   Social History Narrative  . No narrative on file     PHYSICAL EXAM   BP 108/64  Pulse 59  Ht 6' (1.829 m)  Wt 221 lb 4 oz (100.358 kg)  BMI 30.01 kg/m2 Constitutional: He is oriented to person, place, and time. He appears well-developed and well-nourished. No distress.  HENT: No nasal discharge.  Head: Normocephalic and atraumatic.  Eyes: Pupils are equal and round. Right eye exhibits no discharge. Left eye exhibits no discharge.  Neck: Normal range of motion. Neck supple. No JVD present. No thyromegaly present.  Cardiovascular: Normal rate, regular rhythm, normal heart sounds and. Exam reveals no gallop and no friction rub. No murmur heard.  Pulmonary/Chest: Effort normal and breath sounds normal. No stridor. No respiratory distress. He has no wheezes. He has no rales. He exhibits no tenderness.  Abdominal: Soft. Bowel sounds are normal. He exhibits no distension. There is no tenderness. There is no rebound and no guarding.  Musculoskeletal: Normal range of motion. He exhibits no edema and no tenderness.  Neurological: He is alert and oriented to person, place, and time.  Coordination normal.  Skin: Skin is warm and dry. No rash noted. He is not diaphoretic. No erythema. No pallor.  Psychiatric: He has a normal mood and affect. His behavior is normal. Judgment and thought content normal.       EKG: Sinus bradycardia with PVC. No significant ST or T wave changes. Normal QT interval.   ASSESSMENT AND PLAN

## 2012-04-24 ENCOUNTER — Other Ambulatory Visit: Payer: Self-pay

## 2012-04-24 ENCOUNTER — Other Ambulatory Visit (INDEPENDENT_AMBULATORY_CARE_PROVIDER_SITE_OTHER): Payer: Medicare Other

## 2012-04-24 DIAGNOSIS — I4892 Unspecified atrial flutter: Secondary | ICD-10-CM

## 2012-07-19 ENCOUNTER — Ambulatory Visit: Payer: Self-pay | Admitting: General Surgery

## 2012-07-24 ENCOUNTER — Ambulatory Visit: Payer: Self-pay | Admitting: General Surgery

## 2012-07-24 LAB — CBC WITH DIFFERENTIAL/PLATELET
Basophil %: 0.5 %
Eosinophil #: 0.1 10*3/uL (ref 0.0–0.7)
HCT: 45.7 % (ref 40.0–52.0)
HGB: 15.4 g/dL (ref 13.0–18.0)
Lymphocyte %: 32.5 %
MCH: 30.9 pg (ref 26.0–34.0)
MCHC: 33.6 g/dL (ref 32.0–36.0)
MCV: 92 fL (ref 80–100)
Monocyte #: 0.7 x10 3/mm (ref 0.2–1.0)
Neutrophil #: 4.7 10*3/uL (ref 1.4–6.5)
Neutrophil %: 57.9 %
RBC: 4.98 10*6/uL (ref 4.40–5.90)
RDW: 12.7 % (ref 11.5–14.5)

## 2012-07-24 LAB — BASIC METABOLIC PANEL
Anion Gap: 7 (ref 7–16)
Calcium, Total: 8.7 mg/dL (ref 8.5–10.1)
Co2: 28 mmol/L (ref 21–32)
EGFR (African American): >60
EGFR (Non-African Amer.): >60
Osmolality: 283 (ref 275–301)
Potassium: 4.5 mmol/L (ref 3.5–5.1)
Sodium: 140 mmol/L (ref 136–145)

## 2012-07-25 ENCOUNTER — Ambulatory Visit: Payer: Self-pay | Admitting: General Surgery

## 2012-07-31 ENCOUNTER — Inpatient Hospital Stay: Payer: Self-pay | Admitting: Specialist

## 2012-07-31 DIAGNOSIS — R55 Syncope and collapse: Secondary | ICD-10-CM

## 2012-07-31 LAB — TSH: Thyroid Stimulating Horm: 1.07 u[IU]/mL

## 2012-07-31 LAB — COMPREHENSIVE METABOLIC PANEL
Albumin: 3.5 g/dL (ref 3.4–5.0)
Alkaline Phosphatase: 91 U/L (ref 50–136)
Calcium, Total: 8.5 mg/dL (ref 8.5–10.1)
Co2: 26 mmol/L (ref 21–32)
EGFR (Non-African Amer.): >60
Glucose: 109 mg/dL — ABNORMAL HIGH (ref 65–99)
Osmolality: 284 (ref 275–301)
SGOT(AST): 17 U/L (ref 15–37)
SGPT (ALT): 17 U/L (ref 12–78)
Sodium: 141 mmol/L (ref 136–145)
Total Protein: 7 g/dL (ref 6.4–8.2)

## 2012-07-31 LAB — APTT: Activated PTT: 24 secs (ref 23.6–35.9)

## 2012-07-31 LAB — PROTIME-INR: Prothrombin Time: 13.6 secs (ref 11.5–14.7)

## 2012-07-31 LAB — CK TOTAL AND CKMB (NOT AT ARMC)
CK, Total: 74 U/L (ref 35–232)
CK-MB: 0.8 ng/mL (ref 0.5–3.6)
CK-MB: 1.5 ng/mL (ref 0.5–3.6)

## 2012-07-31 LAB — TROPONIN I
Troponin-I: <0.02 ng/mL
Troponin-I: <0.02 ng/mL

## 2012-07-31 LAB — CBC
HCT: 44.2 % (ref 40.0–52.0)
HGB: 14.7 g/dL (ref 13.0–18.0)
MCH: 30.2 pg (ref 26.0–34.0)
MCV: 91 fL (ref 80–100)
RBC: 4.85 10*6/uL (ref 4.40–5.90)

## 2012-08-01 ENCOUNTER — Other Ambulatory Visit: Payer: Self-pay

## 2012-08-01 DIAGNOSIS — R55 Syncope and collapse: Secondary | ICD-10-CM

## 2012-08-01 DIAGNOSIS — I4892 Unspecified atrial flutter: Secondary | ICD-10-CM

## 2012-08-01 LAB — BASIC METABOLIC PANEL
Anion Gap: 9 (ref 7–16)
Calcium, Total: 8.1 mg/dL — ABNORMAL LOW (ref 8.5–10.1)
EGFR (African American): >60
EGFR (Non-African Amer.): >60
Glucose: 94 mg/dL (ref 65–99)
Osmolality: 286 (ref 275–301)

## 2012-08-01 LAB — CBC WITH DIFFERENTIAL/PLATELET
Basophil #: 0.1 10*3/uL (ref 0.0–0.1)
Eosinophil #: 0.2 10*3/uL (ref 0.0–0.7)
Eosinophil %: 1.5 %
HCT: 40.9 % (ref 40.0–52.0)
HGB: 13.9 g/dL (ref 13.0–18.0)
Lymphocyte %: 20.5 %
MCHC: 34.1 g/dL (ref 32.0–36.0)
MCV: 91 fL (ref 80–100)
Monocyte %: 8.2 %
Neutrophil #: 8.3 10*3/uL — ABNORMAL HIGH (ref 1.4–6.5)
Neutrophil %: 69.3 %
RBC: 4.49 10*6/uL (ref 4.40–5.90)
RDW: 12.4 % (ref 11.5–14.5)
WBC: 12 10*3/uL — ABNORMAL HIGH (ref 3.8–10.6)

## 2012-08-01 LAB — TROPONIN I: Troponin-I: <0.02 ng/mL

## 2012-08-01 LAB — CK TOTAL AND CKMB (NOT AT ARMC): CK, Total: 151 U/L (ref 35–232)

## 2012-08-01 LAB — LIPID PANEL
Cholesterol: 130 mg/dL (ref 0–200)
Ldl Cholesterol, Calc: 81 mg/dL (ref 0–100)

## 2012-08-02 ENCOUNTER — Telehealth: Payer: Self-pay

## 2012-08-02 NOTE — Telephone Encounter (Signed)
Message copied by Marcelle Overlie on Thu Aug 02, 2012  3:46 PM ------      Message from: [place] E      Created: Thu Aug 02, 2012  3:43 PM      Regarding: TCM       F/U 7-14 DAYS WITH KLEIN, IF KLEIN NOT AVAILABLE Mariah Milling

## 2012-08-02 NOTE — Telephone Encounter (Signed)
tcm call

## 2012-08-03 NOTE — Telephone Encounter (Signed)
Pt called back Says he is feeling well, post discharge for atrial fib and syncope He denies symptoms of syncope, near syncope or dizziness He confirms compliance with medication prescribed at d/c He asks what the metoprolol is for. I explained this to him His only complaint is that he was more aware of atrial fib last night while lying in bed. Says it is worse when lying on left side.  I reassured him he is probably in atrial fib all day, just more aware of this at hs, when lying still, etc. Understanding verb.  Denies rapid rate, says rate is controlled Confirms appt with Dr. Kirke Corin 10/29 and will call us if he needs [place] sooner

## 2012-08-03 NOTE — Telephone Encounter (Signed)
LMTCB on cell # [phone]

## 2012-08-07 ENCOUNTER — Encounter: Payer: Self-pay | Admitting: *Deleted

## 2012-08-09 ENCOUNTER — Telehealth: Payer: Self-pay

## 2012-08-09 NOTE — Telephone Encounter (Signed)
TCM Discharged [facility] 08/01/2012. Appoint. [facility] 08/14/2012.

## 2012-08-12 ENCOUNTER — Telehealth: Payer: Self-pay | Admitting: Cardiology

## 2012-08-12 NOTE — Telephone Encounter (Signed)
Received call from Mr. Mctier stating that he was recently discharged from the hospital with a.fib. I do not have access to those records. He reports being on flecainide and metoprolol. This morning he developed palpitations and noted his heart rate to be in the 110s. He did feel weak with this, but otherwise denied chest pain, sob, dizziness, or syncope. He states he took an extra Flecainide dose as was instructed to him and feels somewhat better, but states his heart rate is still elevated. I instructed him that if he continues to feel poorly or his symptoms worsen to seek medical attention. Otherwise he is to take his nighttime doses of flecainide and metoprolol and call the office tomorrow. He stated understanding.  Noel, PA-C 08/12/2012 2:54 PM

## 2012-08-14 ENCOUNTER — Encounter: Payer: Self-pay | Admitting: Cardiovascular Disease

## 2012-08-14 ENCOUNTER — Ambulatory Visit (INDEPENDENT_AMBULATORY_CARE_PROVIDER_SITE_OTHER): Payer: Medicare Other | Admitting: Cardiovascular Disease

## 2012-08-14 VITALS — BP 120/70 | HR 60 | Ht 72.0 in | Wt 214.2 lb

## 2012-08-14 DIAGNOSIS — I4892 Unspecified atrial flutter: Secondary | ICD-10-CM

## 2012-08-14 MED ORDER — FLECAINIDE ACETATE 50 MG PO TABS: 50.0000 mg | ORAL_TABLET | Freq: Two times a day (BID) | ORAL | Status: DC

## 2012-08-14 NOTE — Patient Instructions (Addendum)
Continue same medications.  You can use an extra dose of Flecainide as needed.  Follow up in 3 months.

## 2012-08-17 ENCOUNTER — Encounter: Payer: Self-pay | Admitting: Cardiovascular Disease

## 2012-08-17 NOTE — Assessment & Plan Note (Signed)
He is currently in sinus rhythm. He seems to be doing well on flecainide. He had an echocardiogram done during his hospitalization which showed an ejection fraction of 50-55% with mild to moderate mitral regurgitation. Left atrial size was normal. Continue treatment with flecainide 50 mg twice daily. He can use an additional dose as needed for breakthrough palpitations and tachycardia. The episodes become more frequent, we can increase the dose to 100 mg once daily. Continue anticoagulation with Xarelto. Given that he has both atrial flutter and fibrillation, we'll reserve ablation for failing at least one antiarrhythmic medication.

## 2012-08-17 NOTE — Progress Notes (Signed)
HPI  Billy Carr is a 66 y.o. male who is here today for a followup visit after recent hospitalization at [facility]. He has a history of syncope,  orthostatic intolerance and atrial flutter/fibrillation.   Last year an event recorder was negative; a stress test  demonstrated normal left ventricular function with 90% achieved maximal heart rate and no ischemia  He was seen Feb 2013 with tachy palpitations and was found to be in atrial flutter which terminated spontaneously couple of days afterwards.  At that time there was a discussion regarding possible ablation; but he was having nocturnal tachy palpitations and an event recorder was ordered>> atrial fib with spells occurring in clusters the longest of which was about an hour and a half with an average heart rate of about 110 at peak heart rates in the 160s.  He did not tolerate Inderal due to dizziness.  He presented recently at [facility] after he had 3 syncopal episodes all of them were sudden. He was noted to be in atrial flutter and mildly tachycardic by EMS. He was later noted to have atrial fibrillation on telemetry. It was felt that the syncope might have been related to post termination pulses or 1-1 conduction with atrial flutter. CT head was unremarkable. Carotid duplex showed no significant disease. He converted to sinus rhythm with metoprolol and was started on flecainide 50 mg twice daily as well as Xarelto  for anticoagulation.  he has been doing well since hospital discharge except on Sunday when he forgot to take his flecainide. He developed palpitations and tachycardia. He took an extra dose of flecainide and felt back to his normal self.   No Known Allergies   Current Outpatient Prescriptions on File Prior to Visit  Medication Sig Dispense Refill  . docusate sodium (COLACE) 100 MG capsule Take 100 mg by mouth 3 (three) times daily.      . flecainide (TAMBOCOR) 50 MG tablet Take 1 tablet (50 mg total) by mouth every 12 (twelve) hours.   80 tablet  6  . metoprolol succinate (TOPROL-XL) 12.5 mg TB24 Take by mouth every 12 (twelve) hours.      . Multiple Vitamin (MULTIVITAMIN) tablet Take 1 tablet by mouth daily.        . Rivaroxaban (XARELTO) 20 MG TABS Take 1 tablet (20 mg total) by mouth daily.  30 tablet  0     Past Medical History  Diagnosis Date  . Nephrolithiasis   . History of shingles   . Syncope     Neurally mediated  . Vertical nystagmus   . Atrial tachycardia   . Atrial flutter/fib 10/27/2011  . PVC (premature ventricular contraction)     asymptomatic     Past Surgical History  Procedure Date  . Nephrostomy     open, left kidney stone  . Hernia repair     bilateral     Family History  Problem Relation Age of Onset  . Heart failure Mother      History   Social History  . Marital Status: Married    Spouse Name: N/A    Number of Children: N/A  . Years of Education: N/A   Occupational History  . Not on file.   Social History Main Topics  . Smoking status: Never Smoker   . Smokeless tobacco: Never Used  . Alcohol Use: No  . Drug Use: No  . Sexually Active:    Other Topics Concern  . Not on file   Social  History Narrative  . No narrative on file     PHYSICAL EXAM   BP 120/70  Pulse 60  Ht 6' (1.829 m)  Wt 214 lb 4 oz (97.183 kg)  BMI 29.06 kg/m2 Constitutional: He is oriented to person, place, and time. He appears well-developed and well-nourished. No distress.  HENT: No nasal discharge.  Head: Normocephalic and atraumatic.  Eyes: Pupils are equal and round. Right eye exhibits no discharge. Left eye exhibits no discharge.  Neck: Normal range of motion. Neck supple. No JVD present. No thyromegaly present.  Cardiovascular: Normal rate, regular rhythm, normal heart sounds and. Exam reveals no gallop and no friction rub. No murmur heard.  Pulmonary/Chest: Effort normal and breath sounds normal. No stridor. No respiratory distress. He has no wheezes. He has no rales. He  exhibits no tenderness.  Abdominal: Soft. Bowel sounds are normal. He exhibits no distension. There is no tenderness. There is no rebound and no guarding.  Musculoskeletal: Normal range of motion. He exhibits no edema and no tenderness.  Neurological: He is alert and oriented to person, place, and time. Coordination normal.  Skin: Skin is warm and dry. No rash noted. He is not diaphoretic. No erythema. No pallor.  Psychiatric: He has a normal mood and affect. His behavior is normal. Judgment and thought content normal.       EKG: Sinus  Rhythm  -Left axis.     ABNORMAL   ASSESSMENT AND PLAN

## 2012-09-05 ENCOUNTER — Encounter: Payer: Self-pay | Admitting: Cardiovascular Disease

## 2012-10-05 ENCOUNTER — Other Ambulatory Visit: Payer: Self-pay | Admitting: Internal Medicine

## 2012-10-05 MED ORDER — FLECAINIDE ACETATE 50 MG PO TABS: 50.0000 mg | ORAL_TABLET | Freq: Two times a day (BID) | ORAL | Status: DC

## 2012-10-05 NOTE — Telephone Encounter (Signed)
Refilled Flecainide. 

## 2012-11-27 ENCOUNTER — Ambulatory Visit: Payer: Medicare Other | Admitting: Internal Medicine

## 2012-12-04 ENCOUNTER — Ambulatory Visit (INDEPENDENT_AMBULATORY_CARE_PROVIDER_SITE_OTHER): Payer: Medicare Other | Admitting: Internal Medicine

## 2012-12-04 ENCOUNTER — Encounter: Payer: Self-pay | Admitting: Internal Medicine

## 2012-12-04 VITALS — BP 124/72 | HR 55 | Ht 72.0 in | Wt 220.0 lb

## 2012-12-04 DIAGNOSIS — I4892 Unspecified atrial flutter: Secondary | ICD-10-CM

## 2012-12-04 DIAGNOSIS — R55 Syncope and collapse: Secondary | ICD-10-CM

## 2012-12-04 DIAGNOSIS — R002 Palpitations: Secondary | ICD-10-CM

## 2012-12-04 MED ORDER — FLECAINIDE ACETATE 50 MG PO TABS: 75.0000 mg | ORAL_TABLET | Freq: Two times a day (BID) | ORAL | Status: DC

## 2012-12-04 NOTE — Patient Instructions (Addendum)
Stop the Xarelto. Increase Flecainide to 75 mg take one tablet twice a day. Follow up with Dr. Graciela Husbands in 3 months.

## 2012-12-04 NOTE — Progress Notes (Signed)
Patient Care Team: Vonita Moss, MD as PCP - General (Unknown Physician Specialty)   HPI  Billy Carr is a 67 y.o. male  seen in followup for syncope; he also has a history of orthostatic intolerance and atrial tachycardia/ fibrillation neither of which were clearly related to his syncope.   2012 stress test which was done by Dr. Knute Neu and demonstrated normal left ventricular function with 90% achieved maximal heart rate and no ischemia  He was seen Feb 2013 with tachy palpitations and was found to be in atrial flutter which terminated spontaneously couple of days afterwards.  At that time there was a discussion regarding possible ablation; but he was having nocturnal tachy palpitations and an event recorder was ordered>> atrial fib with spells occurring in clusters the longest of which was about an hour and a half with an average heart rate of about 110 at peak heart rates in the 160s.   It was elected to treat him with prn betablockers as he was averse to the use of antiarrhythmic drugs.  He was hospitalized 9/13 with recurrent syncope.  These spells occurred in a cluster assoc with profound diaphoresis and residual orthostatic intolerance.  He was found to be in afib on arrival to ER ( VS info not available from EMS)   thought ? Related to atrial arrhythmias and flecainide was initiated along with Rivaroxaban.   CHADS-VAS score is one (age)  He  continues to have some spells although briefer and on taking pulse notes irregularly irregular, and has significant LH with this and some dyspnea.    Also spells of dizziness and visual disturbance. These are unrelated to position or prolonged standing  He has not taken his pulse during these spells      Past Medical History  Diagnosis Date  . Nephrolithiasis   . History of shingles   . Syncope     Neurally mediated  . Vertical nystagmus   . Atrial tachycardia   . Atrial flutter/fib 10/27/2011  . PVC (premature ventricular contraction)     asymptomatic    Past Surgical History  Procedure Laterality Date  . Nephrostomy      open, left kidney stone  . Hernia repair      bilateral    Current Outpatient Prescriptions  Medication Sig Dispense Refill  . flecainide (TAMBOCOR) 50 MG tablet Take 1 tablet (50 mg total) by mouth every 12 (twelve) hours.  80 tablet  5  . metoprolol succinate (TOPROL-XL) 12.5 mg TB24 Take by mouth every 12 (twelve) hours.      . Multiple Vitamin (MULTIVITAMIN) tablet Take 1 tablet by mouth daily.        . Rivaroxaban (XARELTO) 20 MG TABS Take 1 tablet (20 mg total) by mouth daily.  30 tablet  0   No current facility-administered medications for this visit.    No Known Allergies  Review of Systems negative except from HPI and PMH  Physical Exam BP 124/72  Pulse 55  Ht 6' (1.829 m)  Wt 220 lb (99.791 kg)  BMI 29.83 kg/m2 Well developed and well nourished in no acute distress HENT normal E scleral and icterus clear Neck Supple JVP flat; carotids brisk and full Clear to ausculation  Regular rate and rhythm, no murmurs gallops or rub Soft with active bowel sounds No clubbing cyanosis none Edema Alert and oriented, grossly normal motor and sensory function Skin Warm and Dry  ECG: Sinus Rhythm 55  Intervals  18/08/43  Axis -76     Assessment and  Plan

## 2012-12-04 NOTE — Assessment & Plan Note (Signed)
Recurrent syncope with protracted symptoms and without comment by EMS suggests vasomotor as opposed to be bradycardia. The identified AFib may have served as the trigger.   I think it is reasonable to be relatively aggressive about trying to prevent afib, and given ongoing symptoms, I have increased his flecainide form 50>>75 bid  The episodes of lightheadedness and visual disturbance are not clearly positional, but may be arrhythmic, perhaps related to short atrial tach/PVCs.  He has had a negative neuro eval in the past    We will initially see if get better with increased flecainide, and if not, with use monitor to see if assoc with arrhtyhmia

## 2012-12-04 NOTE — Assessment & Plan Note (Signed)
On flec and metoprolol  Increased dose as above  Will stop  Rivaroxaban 2/2 low risk score (=1)  Reviewed with pt

## 2013-03-07 ENCOUNTER — Ambulatory Visit (INDEPENDENT_AMBULATORY_CARE_PROVIDER_SITE_OTHER): Payer: Medicare Other | Admitting: Internal Medicine

## 2013-03-07 ENCOUNTER — Encounter: Payer: Self-pay | Admitting: Internal Medicine

## 2013-03-07 VITALS — BP 106/60 | HR 58 | Ht 72.0 in | Wt 220.2 lb

## 2013-03-07 DIAGNOSIS — R55 Syncope and collapse: Secondary | ICD-10-CM

## 2013-03-07 DIAGNOSIS — I4892 Unspecified atrial flutter: Secondary | ICD-10-CM

## 2013-03-07 DIAGNOSIS — I493 Ventricular premature depolarization: Secondary | ICD-10-CM

## 2013-03-07 DIAGNOSIS — I4949 Other premature depolarization: Secondary | ICD-10-CM

## 2013-03-07 NOTE — Assessment & Plan Note (Signed)
Continues to have symptomatic. We have reviewed the physiology and I tried to reassure him that they are benign

## 2013-03-07 NOTE — Progress Notes (Signed)
Patient Care Team: Vonita Moss, MD as PCP - General (Unknown Physician Specialty)   HPI  Billy Carr is a 67 y.o. male Seen in followup for syncope in the context of orthostatic intolerance prior history of atrial tachycardia. He was also found in 2013 atrial flutter and subsequently, on the event recorder, and atrial fibrillation. He was also noted to have symptomatic PVCs. W   He then was started on flecainide and this has done relatively well in keeping in sinus rhythm. He was then started on an adjunctive low-dose metoprolol succinate.  He was at [facility] the other day and noted his heart rate was 47; this was without symptoms.  He continues to have problems with "heart heartbeats"   Past Medical History  Diagnosis Date  . Nephrolithiasis   . History of shingles   . Syncope     Neurally mediated-presumed::also ?assoc with AFib  . Vertical nystagmus   . Atrial tachycardia   . Atrial flutter/fib 10/27/2011    flecainide::CHADSVASc--1>> no anticoag 2/14  . PVC (premature ventricular contraction)     asymptomatic    Past Surgical History  Procedure Laterality Date  . Nephrostomy      open, left kidney stone  . Hernia repair      bilateral    Current Outpatient Prescriptions  Medication Sig Dispense Refill  . flecainide (TAMBOCOR) 50 MG tablet Take 1.5 tablets (75 mg total) by mouth every 12 (twelve) hours.  270 tablet  3  . metoprolol succinate (TOPROL-XL) 12.5 mg TB24 Take by mouth every 12 (twelve) hours.      . Multiple Vitamin (MULTIVITAMIN) tablet Take 1 tablet by mouth daily.         No current facility-administered medications for this visit.    No Known Allergies  Review of Systems negative except from HPI and PMH  Physical Exam BP 106/60  Pulse 58  Ht 6' (1.829 m)  Wt 220 lb 4 oz (99.905 kg)  BMI 29.86 kg/m2 Well developed and nourished in no acute distress HENT normal Neck supple with JVP-flat Clear Regular rate and rhythm, no murmurs or  gallops Abd-soft with active BS No Clubbing cyanosis edema Skin-warm and dry A & Oriented  Grossly normal sensory and motor function  ECG demonstrates sinus rhythm at 58 Intervals 16/10/41  Assessment and  Plan Of a

## 2013-03-07 NOTE — Assessment & Plan Note (Signed)
No recurrent atrial arrhythmias of note

## 2013-03-07 NOTE — Patient Instructions (Addendum)
Your physician wants you to follow-up in: 6 months with Dr. Kirke Corin and 1 year with Dr. Graciela Husbands. You will receive a reminder letter in the mail two months in advance. If you don't receive a letter, please call our office to schedule the follow-up appointment.

## 2013-03-07 NOTE — Assessment & Plan Note (Signed)
No recurrent syncope 

## 2013-03-29 ENCOUNTER — Telehealth: Payer: Self-pay

## 2013-03-29 NOTE — Telephone Encounter (Signed)
Pt states neurologist wants to prescribe him a new medication gabapentin 100 mg 3 times a day, pt wants to check w dr Graciela Husbands to make sure he can take this with the rx's dr Graciela Husbands as prescribed him. Please advise

## 2013-03-29 NOTE — Telephone Encounter (Signed)
Is this ok?

## 2013-04-01 NOTE — Telephone Encounter (Signed)
Pt informed

## 2013-04-01 NOTE — Telephone Encounter (Signed)
yes

## 2013-06-26 ENCOUNTER — Telehealth: Payer: Self-pay | Admitting: *Deleted

## 2013-06-26 NOTE — Telephone Encounter (Signed)
Pt states that a neurologist recently prescribed Baclofen 10 mg bid. He read that one of the side effects of Baclofen is irregular heartbeat. He started Baclofen a month ago and his symptoms started a month ago.  He wanted to let Dr. Graciela Husbands know.

## 2013-06-26 NOTE — Telephone Encounter (Signed)
Patient of Dr. Graciela Husbands. For the past month patients heartbeat feels very irregular and fast. Please advise

## 2013-06-26 NOTE — Telephone Encounter (Signed)
Spoke w/ pt.  He states that the flecainide and metoprolol have been keeping his heart regular, up until about a month ago.   Feels that the "irregularity is getting worse, physically it is draining" his energy and it is skipping beats. Denies any shortness of breath. States that he discussed with Dr. Graciela Husbands some occasional left arm pain that is pretty short lived, aches 3-4 minutes, just goes away.  Reports he had this sensation yesterday, but describes it more as an "ache associated with old age". Would like to discuss with Dr. Graciela Husbands or his nurse.  Explained that Dr. Graciela Husbands is in the [place] office and I would send info to him and let him know.   Please call pt as soon as you can.  Thank you.

## 2013-06-26 NOTE — Telephone Encounter (Signed)
Patient called again and wanting to give you additional info

## 2013-06-27 ENCOUNTER — Telehealth: Payer: Self-pay | Admitting: Internal Medicine

## 2013-06-27 NOTE — Telephone Encounter (Signed)
F/u called [place] office yesterday with some concerns but has had no response.  Pt has afib and the medications he is on do not seem to be working.  Pt states he is feeling "weird" and has been checking pulse for around one month.  HR has not getting regulated and pt is feeling drained.  Pt is also feeling some left arm pain since last night.  Seeing neuro and he prescribed a medication for muscle relaxer.  Please call him to discuss this issue.

## 2013-06-28 ENCOUNTER — Other Ambulatory Visit: Payer: Self-pay | Admitting: *Deleted

## 2013-06-28 DIAGNOSIS — I4891 Unspecified atrial fibrillation: Secondary | ICD-10-CM

## 2013-06-28 NOTE — Telephone Encounter (Signed)
Spoke with patient who states that today he feels better than he has in 2 weeks. We discussed the Baclofen and the side effects he is having. Per Dr. Graciela Husbands he is going to come in for BMET check since he is taking this medication. Advised that if he returns to feeling awful like he did he will not wait weeks/month to call us. If symptoms return/persist he agreed to call back for further instructions.

## 2013-07-01 ENCOUNTER — Telehealth: Payer: Self-pay | Admitting: *Deleted

## 2013-07-01 NOTE — Telephone Encounter (Signed)
Set patient up for BMET check in [place] tomorrow at 2:05pm. Pt agreeable to plan

## 2013-07-02 ENCOUNTER — Ambulatory Visit (INDEPENDENT_AMBULATORY_CARE_PROVIDER_SITE_OTHER): Payer: Medicare Other

## 2013-07-02 DIAGNOSIS — I4891 Unspecified atrial fibrillation: Secondary | ICD-10-CM

## 2013-07-03 LAB — BASIC METABOLIC PANEL
BUN/Creatinine Ratio: 19 (ref 10–22)
CO2: 28 mmol/L (ref 18–29)
Chloride: 100 mmol/L (ref 97–108)
Sodium: 142 mmol/L (ref 134–144)

## 2013-07-10 ENCOUNTER — Telehealth: Payer: Self-pay

## 2013-07-10 NOTE — Telephone Encounter (Signed)
BMET results and education on specific lab results (sodium, glucose, potassium) provided to patient (WNL). Advised that Dr. Graciela Husbands has not reviewed them yet and when he does if there are any changes to be made in his treatment plan, Dr. Odessa Fleming nurse will call him back. Otherwise he is to continue on same treatment plan. Patient verbalized understanding and appreciation of information and call back.

## 2013-07-10 NOTE — Telephone Encounter (Signed)
Pt would like lab results.  

## 2013-08-02 ENCOUNTER — Telehealth: Payer: Self-pay | Admitting: *Deleted

## 2013-08-02 MED ORDER — FLECAINIDE ACETATE 100 MG PO TABS: 100.0000 mg | ORAL_TABLET | Freq: Two times a day (BID) | ORAL | Status: DC

## 2013-08-02 NOTE — Telephone Encounter (Signed)
Patient has been in afib all week and he is going out of town on Monday please advise

## 2013-08-02 NOTE — Telephone Encounter (Addendum)
Called patient back. He feels that he has been back in atrial fib since Monday. Not sure of BP or heart rate. Thinks his rate might be about 100 per minute. Leaving for [place]. Tomorrow and will be back on 10/23. Discussed with Dr.Arida and he advised that the patient increase the Flecainide to 100mg  BID and follow up in this office on 10/24. Patient verbalized understanding.

## 2013-08-09 ENCOUNTER — Ambulatory Visit (INDEPENDENT_AMBULATORY_CARE_PROVIDER_SITE_OTHER): Payer: Medicare Other | Admitting: Cardiovascular Disease

## 2013-08-09 ENCOUNTER — Encounter: Payer: Self-pay | Admitting: Cardiovascular Disease

## 2013-08-09 VITALS — BP 122/68 | HR 57 | Ht 72.0 in | Wt 215.5 lb

## 2013-08-09 DIAGNOSIS — I4892 Unspecified atrial flutter: Secondary | ICD-10-CM

## 2013-08-09 DIAGNOSIS — I4891 Unspecified atrial fibrillation: Secondary | ICD-10-CM

## 2013-08-09 DIAGNOSIS — R079 Chest pain, unspecified: Secondary | ICD-10-CM

## 2013-08-09 MED ORDER — ASPIRIN EC 81 MG PO TBEC: 81.0000 mg | DELAYED_RELEASE_TABLET | Freq: Every day | ORAL | Status: DC

## 2013-08-09 NOTE — Progress Notes (Signed)
HPI  Billy Carr is a 67 y.o. male who is here today for a followup visit regarding syncope,  orthostatic intolerance and atrial flutter/fibrillation.   In 2012,  an event recorder was negative; a stress test  demonstrated normal left ventricular function with 90% achieved maximal heart rate and no ischemia  He was seen Feb 2013 with tachy palpitations and was found to be in atrial flutter which terminated spontaneously couple of days afterwards.  At that time there was a discussion regarding possible ablation; but he was having nocturnal tachy palpitations and an event recorder was ordered>> atrial fib with spells occurring in clusters the longest of which was about an hour and a half with an average heart rate of about 110 at peak heart rates in the 160s.  He did not tolerate Inderal due to dizziness.  He presented in 04/2012 to [facility] after he had 3 syncopal episodes all of them were sudden. He was noted to be in atrial flutter and mildly tachycardic by EMS. He was later noted to have atrial fibrillation on telemetry. It was felt that the syncope might have been related to post termination pulses or 1-1 conduction with atrial flutter. CT head was unremarkable. Carotid duplex showed no significant disease. He converted to sinus rhythm with metoprolol and was started on flecainide 50 mg twice daily as well as Xarelto  for anticoagulation.  Anticoagulation was subsequently stopped due to Low CHADs score. He called our office recently due to increased palpitations. He felt that he was in atrial fibrillation for about a week. We  increased the dose of flecainide to 100 mg twice daily. He has been feeling better since then. No chest pain or dyspnea.  No Known Allergies   Current Outpatient Prescriptions on File Prior to Visit  Medication Sig Dispense Refill  . flecainide (TAMBOCOR) 100 MG tablet Take 1 tablet (100 mg total) by mouth 2 (two) times daily.  60 tablet  3  . metoprolol succinate  (TOPROL-XL) 12.5 mg TB24 Take by mouth every 12 (twelve) hours.      . Multiple Vitamin (MULTIVITAMIN) tablet Take 1 tablet by mouth daily.         No current facility-administered medications on file prior to visit.     Past Medical History  Diagnosis Date  . Nephrolithiasis   . History of shingles   . Syncope     Neurally mediated-presumed::also ?assoc with AFib  . Vertical nystagmus   . Atrial tachycardia   . Atrial flutter/fib 10/27/2011    flecainide::CHADSVASc--1>> no anticoag 2/14  . PVC (premature ventricular contraction)     asymptomatic  . Bulging disc      Past Surgical History  Procedure Laterality Date  . Nephrostomy      open, left kidney stone  . Hernia repair      bilateral     Family History  Problem Relation Age of Onset  . Heart failure Mother      History   Social History  . Marital Status: Married    Spouse Name: N/A    Number of Children: N/A  . Years of Education: N/A   Occupational History  . Not on file.   Social History Main Topics  . Smoking status: Never Smoker   . Smokeless tobacco: Never Used  . Alcohol Use: No  . Drug Use: No  . Sexual Activity: Not on file   Other Topics Concern  . Not on file   Social History  Narrative  . No narrative on file     PHYSICAL EXAM   BP 122/68  Pulse 57  Ht 6' (1.829 m)  Wt 215 lb 8 oz (97.75 kg)  BMI 29.22 kg/m2 Constitutional: He is oriented to person, place, and time. He appears well-developed and well-nourished. No distress.  HENT: No nasal discharge.  Head: Normocephalic and atraumatic.  Eyes: Pupils are equal and round. Right eye exhibits no discharge. Left eye exhibits no discharge.  Neck: Normal range of motion. Neck supple. No JVD present. No thyromegaly present.  Cardiovascular: Normal rate, regular rhythm, normal heart sounds and. Exam reveals no gallop and no friction rub. No murmur heard.  Pulmonary/Chest: Effort normal and breath sounds normal. No stridor. No  respiratory distress. He has no wheezes. He has no rales. He exhibits no tenderness.  Abdominal: Soft. Bowel sounds are normal. He exhibits no distension. There is no tenderness. There is no rebound and no guarding.  Musculoskeletal: Normal range of motion. He exhibits no edema and no tenderness.  Neurological: He is alert and oriented to person, place, and time. Coordination normal.  Skin: Skin is warm and dry. No rash noted. He is not diaphoretic. No erythema. No pallor.  Psychiatric: He has a normal mood and affect. His behavior is normal. Judgment and thought content normal.       EKG:  sinus rhythm with normal PR and QT intervals   ASSESSMENT AND PLAN

## 2013-08-09 NOTE — Assessment & Plan Note (Signed)
He is feeling better after increasing the dose of flecainide 100 mg twice daily. This will be continued at the current dose. I asked him to resume aspirin 81 mg once daily. If he develops recurrent atrial arrhythmia, I recommend considering a different antiarrhythmic medication or referral for catheter ablation.

## 2013-08-09 NOTE — Patient Instructions (Signed)
Resume Aspirin 81 mg once daily.   Your physician wants you to follow-up in: 6 months.  You will receive a reminder letter in the mail two months in advance. If you don't receive a letter, please call our office to schedule the follow-up appointment.

## 2013-08-13 ENCOUNTER — Telehealth: Payer: Self-pay | Admitting: *Deleted

## 2013-08-13 ENCOUNTER — Ambulatory Visit: Payer: Medicare Other | Admitting: Cardiovascular Disease

## 2013-08-13 NOTE — Telephone Encounter (Signed)
Continue Flecainide 100 mg twice daily. Send him a proscription for Metoprolol Tartrate 25 mg to be taken prn for heart rate above 100.

## 2013-08-13 NOTE — Telephone Encounter (Signed)
HR is high running double what it usually does at 108 then back down to 70. Please advise patient

## 2013-08-13 NOTE — Telephone Encounter (Signed)
I spoke with the patient. He saw Dr. Kirke Corin on Friday and was feeling well. He states that Sunday night he was at home and felt like his heart was out of rhythm with HR's around 105-110 bpm. He and his wife are out of town now. He feels like his heart rates are still flucuating, but in the 65-70 range. He does feel like his pulse is irregular now. He denies SOB or increasing rates with activity. I advised I will review with Dr. Kirke Corin and call him back. He is agreeable.

## 2013-08-13 NOTE — Telephone Encounter (Signed)
I spoke with the patient. He is aware of Dr. Jari Sportsman recommendations for PRN metoprolol tartrate. He is currently out of town. He will call us back if he is having problems prior to home on Thursday and we can send a RX to a local pharmacy where is he currently out of town. Otherwise, he will notify the office when returning to town so we can send in his local RX.

## 2013-10-16 ENCOUNTER — Other Ambulatory Visit: Payer: Self-pay | Admitting: *Deleted

## 2013-10-16 ENCOUNTER — Other Ambulatory Visit: Payer: Self-pay

## 2013-10-16 MED ORDER — METOPROLOL SUCCINATE 12.5 MG HALF TABLET: 12.5000 mg | ORAL_TABLET | Freq: Two times a day (BID) | ORAL | Status: DC

## 2013-10-16 NOTE — Telephone Encounter (Signed)
Requested Prescriptions   Signed Prescriptions Disp Refills  . metoprolol succinate (TOPROL-XL) 12.5 mg TB24 24 hr tablet 60 tablet 3    Sig: Take 0.5 tablets (12.5 mg total) by mouth every 12 (twelve) hours.    Authorizing Provider: Lorine Bears A    Ordering User: Kendrick Fries

## 2014-01-28 IMAGING — CT CT ABD-PELV W/O CM
1 of 2 series · 15 of 32 positions shown, 19 images · non-contrast
Comparison: None

REASON FOR EXAM: left inguinal hernia   ORAL CONTRAST ONLY
COMMENTS:

PROCEDURE:     KCT - KCT ABDOMEN/PELVIS WO  - July 19, 2012 [DATE]
RESULT:     Indication: Left inguinal pain
TECHNIQUE: Multiple axial images from the lung bases to the symphysis pubis
were obtained without oral and without intravenous contrast.

[Series 2: abd 3mm wo 3.0 i40f 3 · axial · 0.80mm/px · z∈[-1064,-581]mm · 15 of 175 slices shown, 19 images]
[im 7/175  soft-tissue]
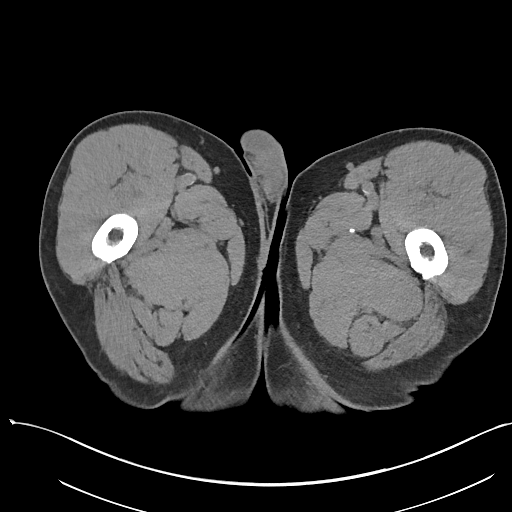
[im 7/175  bone]
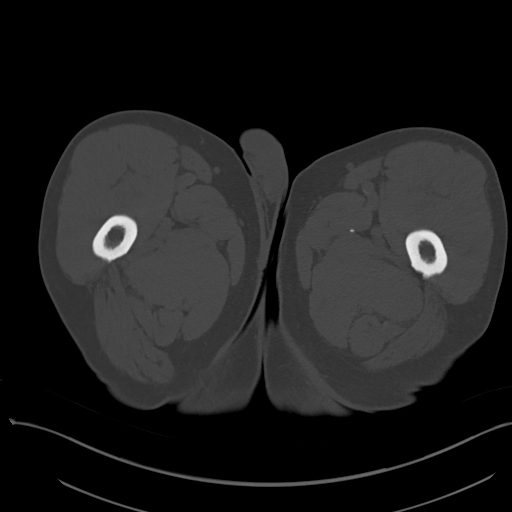
[im 21/175  soft-tissue]
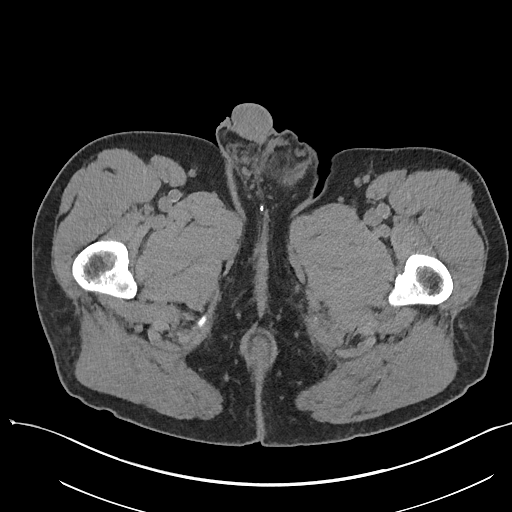
[im 34/175  soft-tissue]
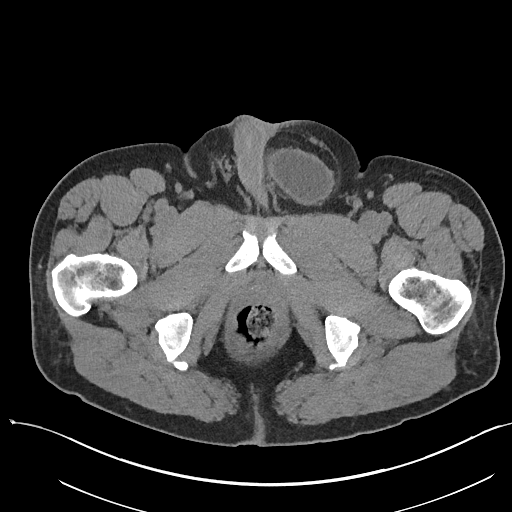
[im 47/175  soft-tissue]
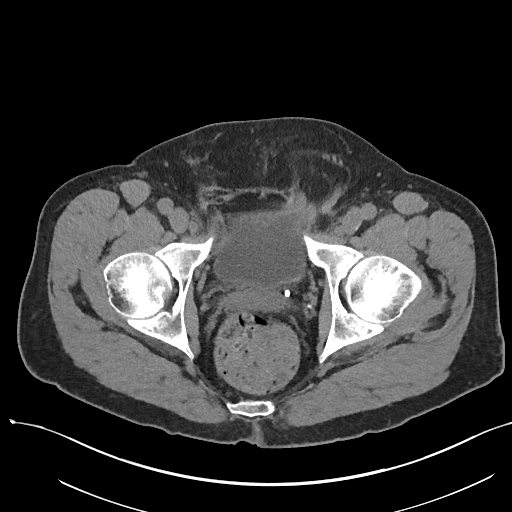
[im 61/175  soft-tissue]
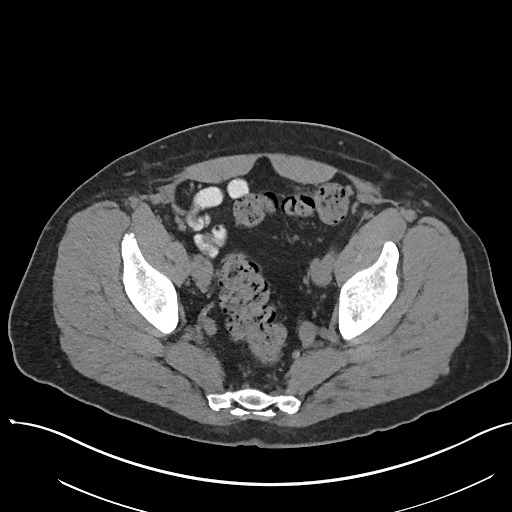
[im 74/175  soft-tissue]
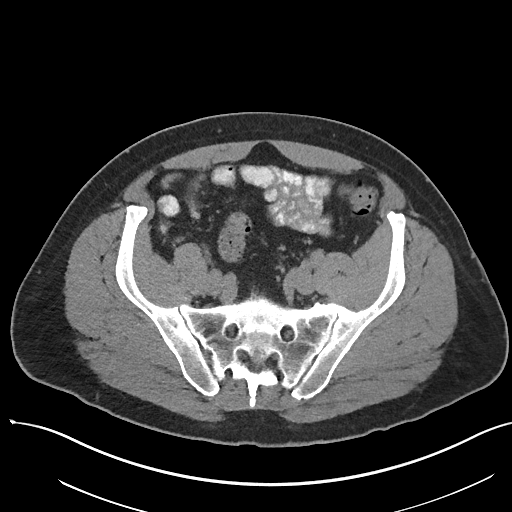
[im 88/175  soft-tissue]
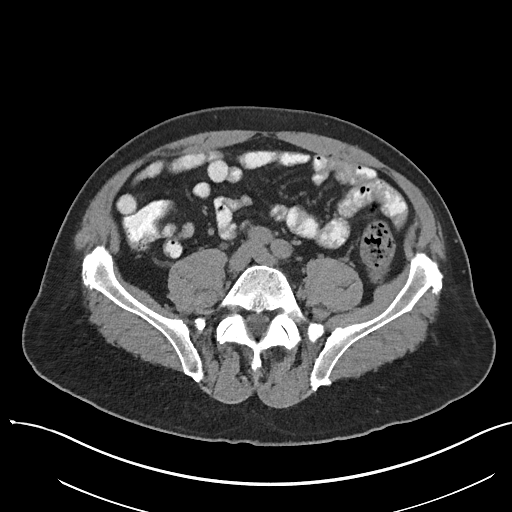
[im 101/175  soft-tissue]
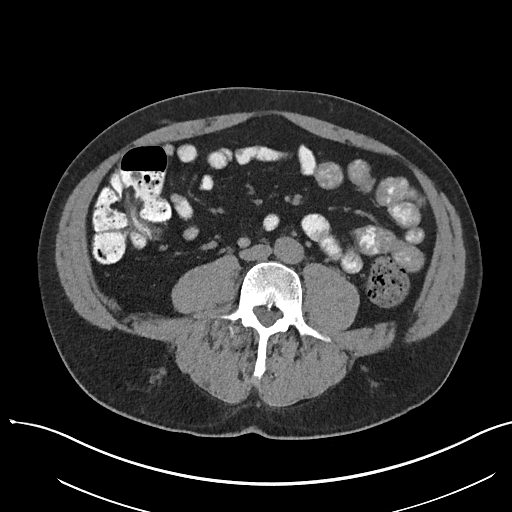
[im 114/175  soft-tissue]
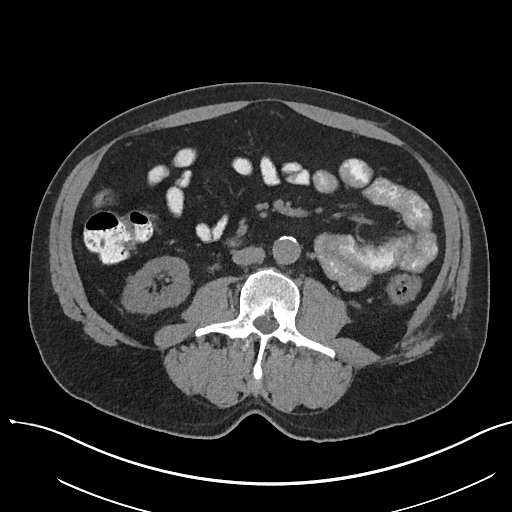
[im 114/175  bone]
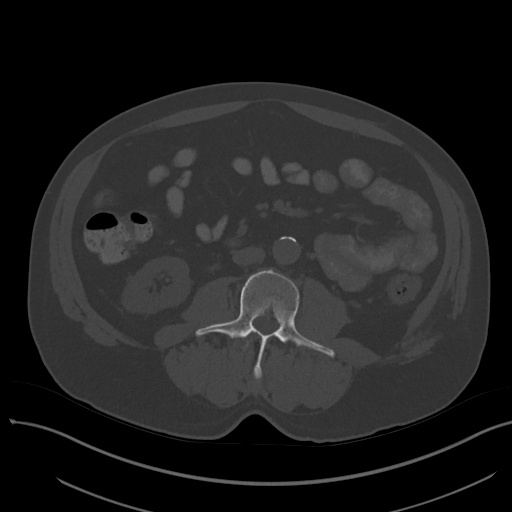
[im 128/175  soft-tissue]
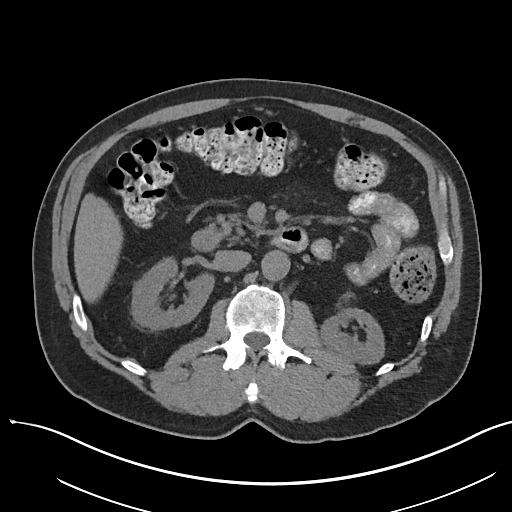
[im 141/175  soft-tissue]
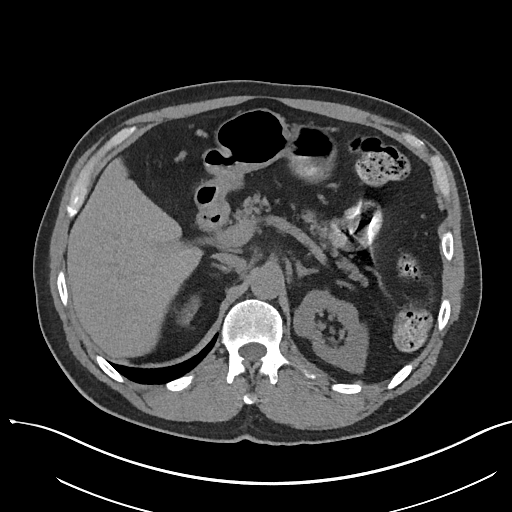
[im 148/175  lung]
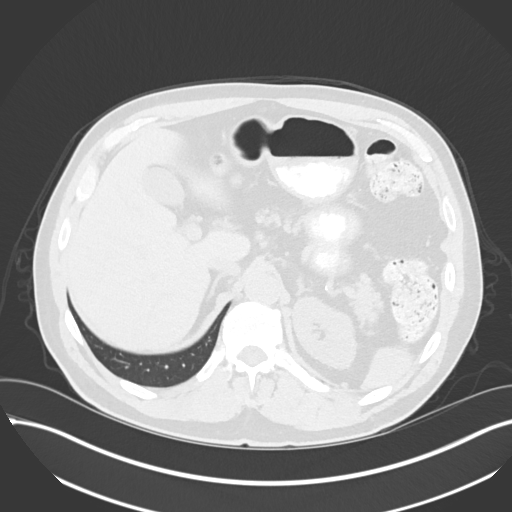
[im 154/175  soft-tissue]
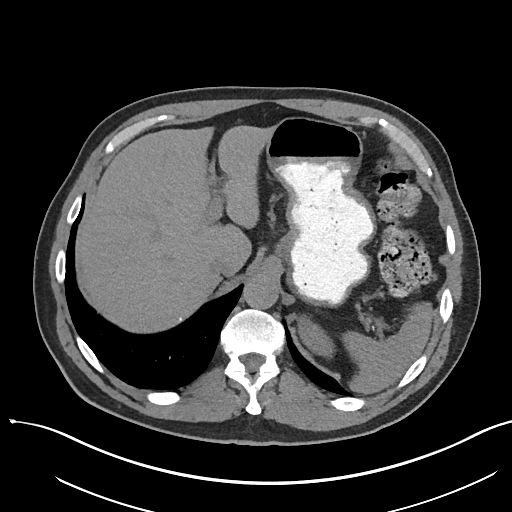
[im 154/175  lung]
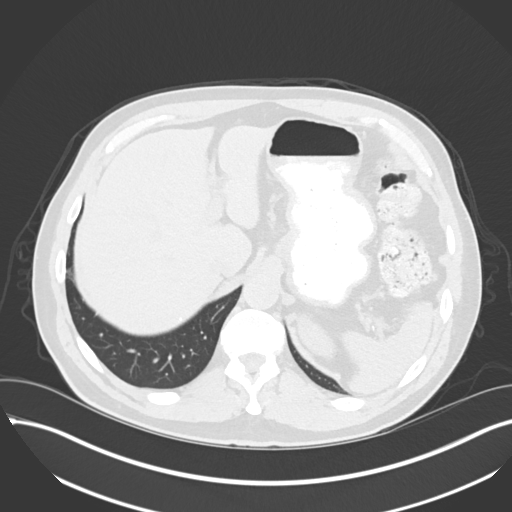
[im 161/175  lung]
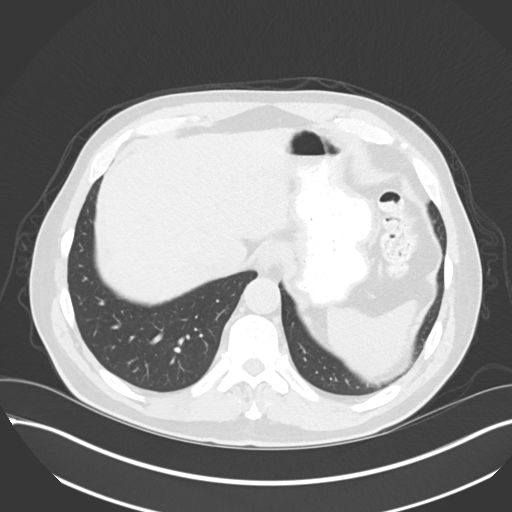
[im 168/175  soft-tissue]
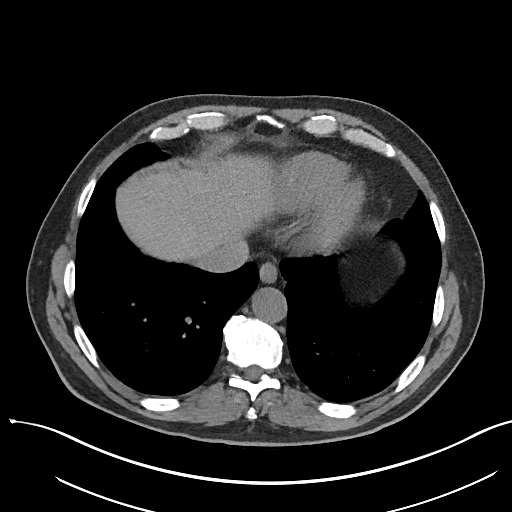
[im 168/175  lung]
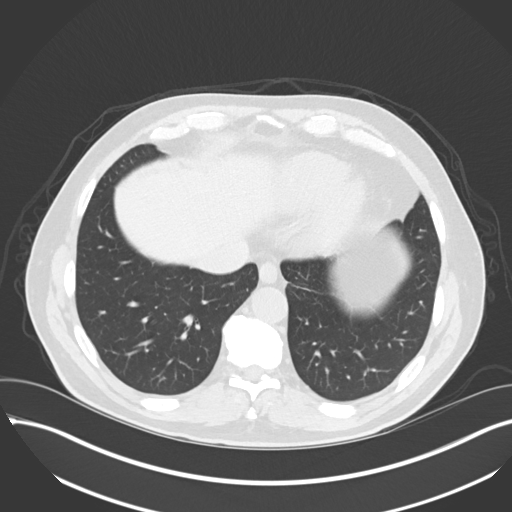

[15 of 32 positions shown; findings below may reference images not displayed]

FINDINGS: The lung bases are clear. There is no pleural or pericardial effusions.

There is nonobstructing left nephrolithiasis. No perinephric stranding is
seen. The kidneys are symmetric in size without evidence for exophytic mass.
There is a left inguinal hernia containing a portion of the bladder.

The liver demonstrates no focal abnormality. The gallbladder is
unremarkable. The spleen demonstrates no focal abnormality. The adrenal
glands and pancreas are normal.

The unopacified stomach, duodenum, small intestine, and large intestine are
unremarkable, but evaluation is limited by lack of oral contrast.  There is
no pneumoperitoneum, pneumatosis, or portal venous gas. There is no
abdominal or pelvic free fluid. There is no lymphadenopathy.

The abdominal aorta is normal in caliber with atherosclerosis.

The osseous structures are unremarkable.
IMPRESSION: 1. There is a left inguinal hernia containing a portion of the bladder.

[REDACTED]

## 2014-04-29 ENCOUNTER — Telehealth: Payer: Self-pay | Admitting: *Deleted

## 2014-04-29 NOTE — Telephone Encounter (Signed)
[facility] with Dr. Alberteen SamJohn Mounsey

## 2014-10-11 ENCOUNTER — Other Ambulatory Visit: Payer: Self-pay | Admitting: Cardiovascular Disease

## 2015-02-03 NOTE — Discharge Summary (Signed)
PATIENT NAME:  Billy Carr, Billy Carr MR#:  [id number] DATE OF BIRTH:  11-17-1945  DATE OF ADMISSION:  07/31/2012 DATE OF DISCHARGE:  08/01/2012  For a detailed note, please take a look at the history and physical done on admission by Dr. Auburn BilberryShreyang Carr.  DISCHARGE DIAGNOSES: 1. Syncope likely secondary to atrial fibrillation/flutter.  2. New onset atrial fibrillation/flutter.   CONSULTANTS:  1. Billy BearsMuhammad Arida, MD - Cardiology. 2. Billy Nordmannimothy Gollan, MD - Cardiology.  PERTINENT STUDIES DURING HOSPITAL COURSE: CT scan of the head done without contrast on admission showed no acute intracranial process.   CT scan of the cervical spine showed no acute osseous injury of the cervical spine.   Ultrasound of the carotids showed atherosclerotic disease without evidence of hemodynamically significant carotid stenosis.   Two-dimensional echocardiogram - results are still pending.  DISCHARGE MEDICATIONS: 1. Multivitamin Centrum daily.  2. Tylenol with oxycodone 5/325 mg one tab every four hours as needed for pain. 3. Colace 100 mg three times daily as needed.  4. Metoprolol tartrate 12.5 mg twice a day. 5. Xarelto 20 mg daily.  6. Flecainide 50 mg twice a day.   DISCHARGE DIET: The patient is being discharged on a low-sodium diet.   DISCHARGE ACTIVITY: As tolerated.   DISCHARGE FOLLOWUP: Follow-up with Dr. Eldridge DaceMuhammed Carr or Dr. Mariah Carr in the next 1 to 2 weeks.  BRIEF HOSPITAL COURSE: This is a 69 year old male who presented to the hospital secondary to recurrent syncopal episodes.  1. Syncope. The most likely cause of the patient's recurrent syncopal episodes was intermittent bursts of atrial fib/flutter. The patient apparently was newly diagnosed with atrial fibrillation/flutter on a Holter monitor as an outpatient and now presented with syncopal episodes. He was not noted to be orthostatic. He had a CT of the head which was negative and a carotid duplex which was also negative. He also had three  sets of cardiac markers checked which were also negative. On telemetry he did have a few bursts of atrial fibrillation and flutter. He was started on low dose beta blocker and actually converted back to a sinus rhythm.  2. New onset atrial fibrillation/flutter. As mentioned, the patient was recently diagnosed with this based on a Holter monitor as an outpatient. He was supposed to follow up with Dr. Graciela Carr for possible ablation, although he had converted to a sinus rhythm here in the hospital after being seen by Dr. Mariah Carr and he recommended starting the patient on some flecainide and some metoprolol and continue him on some Xarelto and have him followup  with them as an outpatient. If he continues to have problems then he may benefit from ablation which can be done at a later point. Currently, for his atrial fibrillation, as mentioned, he was discharged on rate control medications including metoprolol, flecainide, and Xarelto as an anticoagulant.   CODE STATUS: THE PATIENT IS A FULL CODE.  TIME SPENT ON DISCHARGE: 40 minutes.  ____________________________ Billy PancakeVivek Carr. Cherlynn KaiserSainani, MD vjs:slb D: 08/01/2012 13:42:16 ET     T: 08/01/2012 14:25:35 ET       JOB#: [id number] cc: Billy PancakeVivek Carr. Cherlynn KaiserSainani, MD, <Dictator> Muhammad A. Kirke CorinArida, MD Houston SirenVIVEK Carr Aneka Fagerstrom MD ELECTRONICALLY SIGNED 08/02/2012 8:31

## 2015-02-03 NOTE — Consult Note (Signed)
General Aspect Billy Carr is a 69 y.o. male with history of syncope; he also has a history of orthostatic intolerance and atrial arrhythmia.    Last year an event recorder was negative; a stress test demonstrated normal left ventricular function with 90% achieved maximal heart rate and no ischemia .   He was seen Feb 2013 with tachy palpitations and was found to be in atrial flutter which terminated spontaneously couple of days afterwards.   At that time there was a discussion regarding possible ablation; but he was having nocturnal tachy palpitations and an event recorder was ordered which showed atrial fib with spells occurring in clusters the longest of which was about an hour and a half with an average heart rate of about 110 at peak heart rates in the 160s.   he was started on Inderal to be used as needed. He has not been using this medication due to intolerance. He has noticed increased episodes of palpitations. He presented today after he had 3 syncopal episodes. All of them were sudden in with a very brief period EMS were called and he was noted to be in typical atrial flutter at the rate was not very fast. On telemetry he is noted to have intermittent atrial fibrillation. he is very active and exercises on a regular basis without significant limitations. He has no history of hypertension, diabetes or stroke. he underwent a hernia repair surgery last week without complications.   Physical Exam:   GEN no acute distress    NECK supple    RESP normal resp effort  clear BS    CARD Irregular rate and rhythm  No murmur    ABD denies tenderness    LYMPH negative neck    EXTR negative cyanosis/clubbing, negative edema    SKIN normal to palpation    PSYCH alert, A+O to time, place, person   Review of Systems:   Subjective/Chief Complaint syncope    General: No Complaints    Skin: No Complaints    ENT: No Complaints    Eyes: No Complaints    Neck: No Complaints     Respiratory: No Complaints  Short of breath    Cardiovascular: No Complaints    Gastrointestinal: No Complaints    Genitourinary: No Complaints    Neurologic: Fainting    Hematologic: No Complaints    Endocrine: No Complaints    Psychiatric: No Complaints   Lab Results: Thyroid:  15-Oct-13 08:33    Thyroid Stimulating Hormone 1.07 (0.45-4.50 (International Unit)  ----------------------- Pregnant patients have  different reference  ranges for TSH:  - - - - - - - - - -  Pregnant, first trimetser:  0.36 - 2.50 uIU/mL)  Hepatic:  15-Oct-13 08:33    Bilirubin, Total 0.6   Alkaline Phosphatase 91   SGPT (ALT) 17   SGOT (AST) 17   Total Protein, Serum 7.0   Albumin, Serum 3.5  Routine Chem:  15-Oct-13 08:33    Glucose, Serum  109   BUN  19   Creatinine (comp) 0.91   Sodium, Serum 141   Potassium, Serum 4.2   Chloride, Serum 106   CO2, Serum 26   Calcium (Total), Serum 8.5   Osmolality (calc) 284   eGFR (African American) >60   eGFR (Non-African American) >60 (eGFR values <43m/min/1.73 m2 may be an indication of chronic kidney disease (CKD). Calculated eGFR is useful in patients with stable renal function. The eGFR calculation will not be reliable in acutely  ill patients when serum creatinine is changing rapidly. It is not useful in  patients on dialysis. The eGFR calculation may not be applicable to patients at the low and high extremes of body sizes, pregnant women, and vegetarians.)   Anion Gap 9  Cardiac:  15-Oct-13 08:33    Troponin I < 0.02 (0.00-0.05 0.05 ng/mL or less: NEGATIVE  Repeat testing in 3-6 hrs  if clinically indicated. >0.05 ng/mL: POTENTIAL  MYOCARDIAL INJURY. Repeat  testing in 3-6 hrs if  clinically indicated. NOTE: An increase or decrease  of 30% or more on serial  testing suggests a  clinically important change)   CK, Total 74   CPK-MB, Serum 0.8 (Result(s) reported on 31 Jul 2012 at 09:12AM.)    16:25    Troponin I < 0.02  (0.00-0.05 0.05 ng/mL or less: NEGATIVE  Repeat testing in 3-6 hrs  if clinically indicated. >0.05 ng/mL: POTENTIAL  MYOCARDIAL INJURY. Repeat  testing in 3-6 hrs if  clinically indicated. NOTE: An increase or decrease  of 30% or more on serial  testing suggests a  clinically important change)   CK, Total 136   CPK-MB, Serum 1.5 (Result(s) reported on 31 Jul 2012 at 05:08PM.)  Routine Coag:  15-Oct-13 08:33    Activated PTT (APTT) 24.0 (A HCT value >55% may artifactually increase the APTT. In one study, the increase was an average of 19%. Reference: "Effect on Routine and Special Coagulation Testing Values of Citrate Anticoagulant Adjustment in Patients with High HCT Values." American Journal of Clinical Pathology 2006;126:400-405.)   Prothrombin 13.6   INR 1.0 (INR reference interval applies to patients on anticoagulant therapy. A single INR therapeutic range for coumarins is not optimal for all indications; however, the suggested range for most indications is 2.0 - 3.0. Exceptions to the INR Reference Range may include: Prosthetic heart valves, acute myocardial infarction, prevention of myocardial infarction, and combinations of aspirin and anticoagulant. The need for a higher or lower target INR must be assessed individually. Reference: The Pharmacology and Management of the Vitamin K  antagonists: the seventh ACCP Conference on Antithrombotic and Thrombolytic Therapy. DPOEU.2353 Sept:126 (3suppl): [id number]. A HCT value >55% may artifactually increase the PT.  In one study,  the increase was an average of 25%. Reference:  "Effect on Routine and Special Coagulation Testing Values of Citrate Anticoagulant Adjustment in Patients with High HCT Values." American Journal of Clinical Pathology 2006;126:400-405.)  Routine Hem:  15-Oct-13 08:33    WBC (CBC) 8.9   RBC (CBC) 4.85   Hemoglobin (CBC) 14.7   Hematocrit (CBC) 44.2   Platelet Count (CBC) 200 (Result(s) reported on 31 Jul 2012 at 08:53AM.)   MCV 91   MCH 30.2   MCHC 33.2   RDW 12.3   EKG:   Interpretation atrial flutter   Radiology Results: [place]:    15-Oct-13 10:54, US Carotid Doppler Bilateral   US Carotid Doppler Bilateral    REASON FOR EXAM:    syncope  COMMENTS:       PROCEDURE: [place]  - US CAROTID DOPPLER BILATERAL  - Jul 31 2012 10:54AM     RESULT: Carotid Doppler interrogation is performed in the standard   fashion with grayscale, color Doppler and spectral Doppler assessment.   The study demonstrates antegrade flow in both vertebral arteries without   flow reversal. There is some noncalcified plaque in the left carotid bulb   and some minimal plaque an intimal thickening bilaterally. Visually there   is no high degree  of stenosis or significant ulceration. The Doppler   waveforms appear within normal limits. The peak systolic velocities are   normal bilaterally. The internal to common carotid peak systolic velocity   ratio is 0.72 on the right and 0.61 on the left.    IMPRESSION:   1. Atherosclerotic disease without evidence of hemodynamically   significant stenosis.    Dictation Site: 2          Verified By: Sundra Aland, M.D., MD  CT:    15-Oct-13 08:52, CT Head Without Contrast   CT Head Without Contrast    REASON FOR EXAM:    syncope and head injury  COMMENTS:       PROCEDURE: CT  - CT HEAD WITHOUT CONTRAST  - Jul 31 2012  8:52AM     RESULT: Comparison:  None    Technique: Multiple axial images from the foramen magnum to the vertex   were obtained without IV contrast.    Findings:      There is no evidence of mass effect, midline shift, or extra-axial fluid   collections.  There is no evidence of a space-occupying lesion or   intracranial hemorrhage. There is no evidence of a cortical-based area of    acute infarction.      The ventricles and sulci are appropriate for the patient's age. The basal   cisterns are patent.    Visualized portions of the orbits  are unremarkable. The visualized   portions of the paranasal sinuses and mastoid air cells are unremarkable.     The osseous structures are unremarkable.    IMPRESSION:      No acute intracranial process.      Dictation Site: 1          Verified By: Jennette Banker, M.D., MD    No Known Allergies:   Vital Signs/Nurse's Notes: **Vital Signs.:   15-Oct-13 11:00   Vital Signs Type Admission   Temperature Temperature (F) 97.6   Celsius 36.4   Temperature Source Oral   Pulse Pulse 72   Respirations Respirations 20   Systolic BP Systolic BP 086   Diastolic BP (mmHg) Diastolic BP (mmHg) 71   Pulse Lying Pulse Lying 72   Systolic BP Systolic BP 578   Diastolic BP (mmHg) Diastolic BP (mmHg) 70   Pulse Pulse Sitting 73   Systolic BP Systolic BP 469   Diastolic BP (mmHg) Diastolic BP (mmHg) 78   Pulse Standing Pulse Standing 150   Pulse Ox % Pulse Ox % 95   Pulse Ox Activity Level  At rest   Oxygen Delivery Room Air/ 21 %    19:46   Vital Signs Type Routine   Temperature Temperature (F) 98.2   Celsius 36.7   Temperature Source Oral   Pulse Pulse 72   Respirations Respirations 20   Systolic BP Systolic BP 629   Diastolic BP (mmHg) Diastolic BP (mmHg) 76   Mean BP 87   Pulse Ox % Pulse Ox % 97   Pulse Ox Activity Level  At rest   Oxygen Delivery Room Air/ 21 %  *Intake and Output.:   Shift 15-Oct-13 23:00   Grand Totals Intake:  495 Output:      Net:  495 24 Hr.:  -190   Oral Intake      In:  120   IV (Primary)      In:  375   Length of Stay Totals Intake:  960 Output:  1150  Net:  -190     Impression 1. Recurrent syncope highly suggestive of an arrhythmic event. 2. Typical atrial flutter and fibrillation.    Plan the patient's clinical history is highly suggestive of an arrhythmic event leading to syncope. He was noted to be in atrial flutter by EMS. There is a possibility of 1-1 conduction or postconversion pauses leading to his syncope. He seems to be  having intermittent atrial fibrillation as well but the initial might have been triggered R. atrial flutter.  I recommend anticoagulation with low molecular weight heparin in anticipation of either cardioversion or catheter ablation. a TEE will be needed in that situation. I think proceeding with catheter ablation of atrial flutter might be the best option at this time. I will be discussing the case with Dr. Caryl Comes who knows the patient well.   Electronic Signatures: Kathlyn Sacramento (MD)  (Signed 15-Oct-13 20:49)  Authored: General Aspect/Present Illness, History and Physical Exam, Review of System, Labs, EKG , Radiology, Allergies, Vital Signs/Nurse's Notes, Impression/Plan   Last Updated: 15-Oct-13 20:49 by Kathlyn Sacramento (MD)

## 2015-02-03 NOTE — Op Note (Signed)
PATIENT NAME:  Billy Carr, [name] MR#:  [id number] DATE OF BIRTH:  June 15, 1946  DATE OF PROCEDURE:  07/25/2012  PREOPERATIVE DIAGNOSIS: Recurrent left inguinal hernia.   POSTOPERATIVE DIAGNOSIS: Right inguinal hernia.   OPERATION: Repair of recurrent left inguinal hernia.   SURGEON: Kathreen CosierS. G. Margart Zemanek, MD   ANESTHESIA: General.   COMPLICATIONS: None.   ESTIMATED BLOOD LOSS: Less than 50 mL.   DRAINS: None.   DESCRIPTION OF PROCEDURE: The patient was put to sleep in the supine position on the operating table. The left groin was prepped and draped out as a sterile field. The patient had had a previous repair many years ago and reportedly done with a mesh, but he had had recurrence noted recently which has continued to bother him. CT scan revealed a good portion of the urinary bladder herniating into this area in the medial portion of the inguinal canal, and oftentimes the patient had to push this hernia back in order to evacuate his bladder. No bowels seem to be involved in this.   The previous skin incision was reopened in its entire length and was carefully dissected down using cautery and ligatures of 3-0 Vicryl for control of bleeding. The external oblique was identified and then freed all the way down to the region of the external ring, but significant scarring was encountered near the external ring area; and below this, the cord and the hernial protrusion were identified. The cord was thicker in this region, but it appeared to be thinned out within the inguinal canal area. The external oblique was opened along the line of its fibers, and with very careful dissection opened through the external ring it appeared that the deep portion of the spermatic cord within the inguinal region appeared to be skeletonized from previous surgery. After this was satisfactorily freed, it was noted that the patient, in fact, had a fascial defect a little bit larger than a fingerbreadth size with a good portion of the  retroperitoneal tissue and urinary bladder herniating directly  through the external ring area down towards the scrotum. This hernial protrusion was freed carefully, and then in order to facilitate reduction of this the fascial opening noted was opened on the lateral aspect slightly and then satisfactorily reduced. This area was then closed with plicating stitches of 2-0 Vicryl to keep it in place, following which repair was completed with the use of a ProGrip mesh placed around the internal ring area and tacked down with excess of the mesh excised out on all sides. This was tacked to the pubic tubercle with a single 2-0 PDS stitch. This appeared to reinforce the posterior wall, particularly the fascial defect noted medially where the hernia was. The lateral ends were tucked underneath and then pressed against the muscle. The external oblique was then closed with a running 2-0 PDS and the subcutaneous tissue approximated with 3-0 Vicryl, and the skin with subcuticular 4-0 Vicryl reinforced with Steri-Strips and tincture of benzoin. Dry sterile dressing was placed. The patient tolerated the procedure well. No immediate problems were encountered. He was subsequently extubated and returned to the recovery room in stable condition.   ____________________________ S.Wynona LunaG. Kelsay Haggard, MD sgs:cbb D: 07/26/2012 11:21:16 ET T: 07/26/2012 11:44:57 ET JOB#: [id number]  cc: S.G. Evette CristalSankar, MD, <Dictator> [facility]PL[facility] Wynona LunaG Roxanna Mcever MD ELECTRONICALLY SIGNED 07/26/2012 15:04

## 2015-02-03 NOTE — H&P (Signed)
PATIENT NAME:  Billy Carr, Billy Carr MR#:  [id number] DATE OF BIRTH:  April 18, 1946  DATE OF ADMISSION:  07/31/2012  PRIMARY CARE PHYSICIAN: Vonita MossMark Crissman, MD  CARDIOLOGIST: Sherryl MangesSteven Klein, MD - [facility]  CHIEF COMPLAINT: Syncope x3.   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 The patient tried to get up and try to go to the kitchen and get a glass of water and he collapsed on the floor and again in the kitchen he had a third episode where he passed out. The patient was very diaphoretic and sweaty. Prior to the episode, he was having fluttering sensation. He also had some shortness of breath. He complains of some nonspecific left-sided chest discomfort. The patient reports no previous history of passing out. There was a brief episode of confusion, but there was no postictal state and there was no seizure-type activity, according to his wife, who was there. The patient was noted to have atrial flutter by EMS. His blood pressure was okay when they arrived, however, here he was noted to have some atrial fibrillation with RVR, as well as atrial flutter, in ED. The patient currently feels better, denies any shortness of breath currently or palpitations. He otherwise denies any fevers or chills, no cough, and no abdominal pain, nausea, vomiting, or diarrhea.   PAST MEDICAL HISTORY: History of atrial flutter captured on a 30-day vent monitor.   ALLERGIES: No known drug allergies.  MEDICATIONS: 1. Acetaminophen/oxycodone 325/5 mg one tab p.o. every 4 hours p.r.n.  2. Aspirin 81 mg 1 tab p.o. daily.  3. Centrum Silver 1 tab p.o. daily.  4. Colace 100 mg 1 tab p.o. three times daily.   SOCIAL  HISTORY: He has history of smoking remotely. No alcohol or drug use.   FAMILY HISTORY: No history of coronary artery disease, hypertension, or diabetes, according to the patient.  REVIEW OF SYSTEMS: CONSTITUTIONAL: Denies any fevers, fatigue, or weakness. Had some chest pain earlier. No weight loss or weight gain. EYES: No blurred or double vision. No pain. No redness. No inflammation. No glaucoma. ENT: No tinnitus. No ear pain. No hearing loss. No seasonal or year-round allergies. No difficulty swallowing. RESPIRATORY: No cough. No wheezing. No hemoptysis. CARDIOVASCULAR: Denies any chest pain, orthopnea, or edema. GASTROINTESTINAL: No nausea, vomiting, or diarrhea. No abdominal pain. No hematemesis. GU: Denies any dysuria, hematuria, renal calculus, or frequency. ENDOCRINE: Denies any polyuria, nocturia, or thyroid problems. HEME/LYMPH: Denies anemia, easy bruisability, or bleeding. SKIN: No acne. No rash. No changes in mole, hair or skin. MUSCULOSKELETAL: No pain in the neck, back, or shoulder. NEURO: No numbness. No cerebrovascular accident. No transient ischemic attack. PSYCHIATRIC: No anxiety. No insomnia. No ADD.   PHYSICAL EXAMINATION:   VITAL SIGNS: Temperature 98.5, pulse 85, respirations 16, and blood pressure 112/78.   GENERAL: The patient is a well developed, well nourished male in no acute distress.   HEENT: Pupils are equal, round, and reactive to light and accommodation. There is no conjunctival pallor. No scleral icterus. Nasal exam shows no drainage or ulceration. Oropharynx is clear without any exudates.   NECK: No thyromegaly. No carotid bruits.   CARDIOVASCULAR: Regular rate and rhythm. No murmurs, rubs, clicks, or  gallops. PMI is not displaced.   PULMONARY: Clear to auscultation bilaterally without any rales, rhonchi, or wheezing.   ABDOMEN: Soft, nontender, and nondistended. Positive bowel sounds x4.   EXTREMITIES: No clubbing, cyanosis, or edema.   SKIN: No rash.    LYMPHATICS: No lymph nodes palpable.   VASCULAR: Good DP and PT pulses.   PSYCHIATRIC: Not anxious, depressed.   NEUROLOGICAL: Awake, alert, and oriented x3. Cranial nerves II through XII grossly intact. No focal deficits.  LABORATORY, DIAGNOSTIC AND RADIOLOGIC DATA: Glucose 109, BUN 19, creatinine 0.91, sodium 141, potassium 4.2, chloride 106, CO2 26, and calcium 8.5. LFTs are normal. Troponin less than 0.02. WBC 8.9, hemoglobin 14.7, and platelet count 200. INR 1.0.   EKG showed atrial fibrillation with RVR.   CT scan of the spine showed no acute osseous injury, ligamentous injury is not evaluated.   CT scan of the head shows no acute intracranial processes.   ASSESSMENT AND PLAN: The patient is a 69 year old white male with history of atrial flutter and chronic vertigo who has occasional dizziness and today had three episodes of syncope and collapse.  1. Syncope, likely cardiogenic with atrial flutter, atrial fibrillation noted here. He may have had atrial fibrillation or flutter with RVR leading to hypotension causing the syncope. At this time, we will place him on telemetry, will get cardiology evaluation, and check carotids.  2. Atrial fibrillation/atrial flutter. The patient's CHADS is 0. At this time, we will place him on full dose aspirin. We will start him on low dose metoprolol and get cardiology evaluation. There has been discussion of possible ablation in the past. Cardiology to decide on further evaluation.           3. Miscellaneous. We will place him on Lovenox for deep vein thrombosis prophylaxis.   TIME SPENT: 35 minutes.  ____________________________ Lacie Scotts Allena Katz, MD shp:slb D: 07/31/2012 10:01:25 ET T: 07/31/2012 10:20:14 ET JOB#: [id number]  cc: Jakhia Buxton H. Allena Katz, MD, <Dictator> Steele Sizer, MD Charise Carwin MD ELECTRONICALLY SIGNED 08/02/2012 12:08

## 2015-02-08 NOTE — Consult Note (Signed)
PATIENT NAME:  Billy Carr, Billy Carr MR#:  [id number] DATE OF BIRTH:  [birth year]  DATE OF CONSULTATION:  04/22/2012  REFERRING PHYSICIAN:  Olivia MackieGina Martin, MD   CONSULTING PHYSICIAN:  Herschell Dimesichard Carr. Renae GlossWieting, MD PRIMARY CARE PHYSICIAN: Vonita MossMark Crissman, MD  CARDIOLOGIST: Sherryl MangesSteven Klein, MD   CHIEF COMPLAINT: Palpitations.   HISTORY OF PRESENT ILLNESS: This is a 69 year old man with a history of one year of atrial fibrillation and atrial flutter going on and off. Dr. Graciela HusbandsKlein has suggested an ablation initially but then saw him recently and gave him p.r.n. Inderal. He did have a severe episode on Wednesday for which he took two Inderal and was associated with fluttering really bad, lightheadedness, nausea, pain in the left arm, sweats and shortness of breath and nausea. The Inderal helped. Last night he went to bed, he woke up at 3:30 a.m. this morning, had pain in the left arm, nausea, lightheadedness, chest, shoulder and neck pain that was stabbing, shooting in nature, lasting seconds at a time and burned afterwards. It was associated with shortness of breath and nausea. He came to the Emergency Room for further evaluation. In the Emergency Room, he was found to be in sinus bradycardia. His troponin was negative. Looking back a little over a year ago, he did have a stress test that was negative. Hospitalist Services were contacted for further evaluation.   PAST MEDICAL HISTORY:  1. Atrial fibrillation.  2. Vertigo.   PAST SURGICAL HISTORY:  1. Hernia surgery.  2. Kidney surgery.   ALLERGIES: No known drug allergies.   MEDICATIONS:  1. Dolovent vitamin supplement. 2. Inderal 10 mg p.r.n. t.i.d.   SOCIAL HISTORY: No smoking. No alcohol. He lives with his wife. 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   FAMILY HISTORY: Mother died at 8284 of congestive heart failure. Father died of chronic obstructive pulmonary disease and also alcohol-related.    REVIEW OF SYSTEMS: CONSTITUTIONAL: Positive  for sweats on Wednesday and this morning. Positive for weight loss 20 pounds, trying. No weakness or fatigue. EYES: He does have glasses, and he does have these episodes of blurred vision.  EARS, NOSE, MOUTH, AND THROAT: Positive for tenderness. Positive for decreased hearing. CARDIOVASCULAR: Positive for chest pain. Positive for palpitations. RESPIRATORY: Positive for shortness of breath. No cough. No sputum. No hemoptysis. GASTROINTESTINAL: Positive for nausea. No vomiting. No abdominal pain. No bright red blood per rectum. No melena. No diarrhea. No constipation. GENITOURINARY: No burning on urination or hematuria. MUSCULOSKELETAL: No joint pain or muscle pain. INTEGUMENT: No rashes or eruptions. NEUROLOGIC: No fainting or blackouts but positive for lightheadedness. PSYCHIATRIC: No anxiety or depression. ENDOCRINE: No thyroid problems. HEMATOLOGIC/LYMPHATIC: No anemia, no easy bruising or bleeding.   PHYSICAL EXAMINATION:  VITAL SIGNS: Temperature 97.2, pulse 56, respirations 18, blood pressure 126/75, pulse oximetry 98%.   GENERAL: No respiratory distress.   HEENT: Eyes: Conjunctivae and lids normal. Pupils are equal, round, and reactive to light. Extraocular muscles are intact. No nystagmus. Ears, nose, mouth, and throat: Tympanic membranes no erythema. Nasal mucosa no erythema. Throat no erythema. No exudate seen. Lips and gums no lesions.   NECK: No JVD. No bruits. No lymphadenopathy. No thyromegaly. No thyroid nodules palpated.   LUNGS: Lungs are clear to auscultation. No use of accessory muscles to breathe. No rhonchi, rales, or wheeze heard.   CARDIOVASCULAR: S1, S2, regular. No gallops, rubs, or murmurs heard. Carotid upstroke 2+ bilaterally. No bruits. Dorsalis pedis pulses 2+ bilaterally. No edema of the lower extremity.  ABDOMEN: Soft, nontender. No organomegaly/splenomegaly. Normoactive bowel sounds. No masses felt.   LYMPHATIC: No lymph nodes in the neck.   MUSCULOSKELETAL: No  clubbing, edema, or cyanosis.   SKIN: No rashes or ulcers seen.   NEUROLOGIC: Cranial nerves II through XII are grossly intact. Deep tendon reflexes are 2+ bilateral lower extremities.   PSYCHIATRIC: The patient is oriented to person, place, and time.   LABORATORY, DIAGNOSTIC AND RADIOLOGICAL DATA:   Chest x-ray: No acute changes.  Glucose 103, BUN 19, creatinine 1.11, sodium 141, potassium 3.8, chloride 106, CO2 30, calcium 8.9.  White blood cell count 8.2, hemoglobin and hematocrit 14.7 and 45.3, platelet count 177.  Troponin negative.  EKG shows sinus bradycardia at 55 beats per minute, no acute ST-T wave changes.   ASSESSMENT AND PLAN:  1. Rapid atrial fibrillation or flutter that broke upon presentation with Inderal: I did page Dr. Mariah Milling, Cardiology. We decided that we can treat as outpatient since he broke. Follow-up will be set up by his office. His office will call the patient tomorrow to set up a follow-up appointment. He was advised to continue his Inderal if needed for these palpitations. As outpatient, he can consider either ablation or antiarrhythmic medication such as flecainide. I will hold off on standing dose beta blocker or calcium channel blocker secondary to the patient's bradycardia. I am unsure where his heart rate is when he is functioning normal, could be in the 50s since he is a runner. The patient's symptoms are mostly at night. Since the patient is asymptomatic at this point, I will discharge home. I will start full-dose aspirin 325 mg on a daily basis for anticoagulation. I will leave it up to Cardiology on further anticoagulation or not.  2. Chest pain, neck pain and arm pain: With the patient having a stress test that was negative, a low risk scan a little over a year ago, I doubt this is cardiac. The patient's first troponin is negative. The patient is feeling fine now. I think the patient's symptoms are related to the fast heart rate which he is very sensitive to.   3. The patient does have vertigo and blurred vision, unclear etiology of this. He has been evaluated in the past with multiple testing done. Follow-up as outpatient with Dr. Dossie Arbour for this.   TIME SPENT ON EMERGENCY ROOM CONSULTATION AND DISCHARGE: 60 minutes.   ____________________________ Herschell Dimes. Renae Gloss, MD rjw:cbb D: 04/22/2012 12:25:18 ET T: 04/22/2012 13:28:16 ET JOB#: [id number]  cc: Herschell Dimes. Renae Gloss, MD, <Dictator> Steele Sizer, MD Duke Salvia, MD Salley Scarlet MD ELECTRONICALLY SIGNED 04/22/2012 22:05

## 2016-05-24 ENCOUNTER — Telehealth: Payer: Self-pay | Admitting: Cardiovascular Disease

## 2016-05-24 NOTE — Progress Notes (Signed)
Cardiology Office Note Date:  05/25/2016  Patient ID:  Billy Carr, DOB 07/05/1946, MRN [id number] PCP:  Delton Prairieobin, Paul, MD  [facility]    Chief Complaint: Palpitations   History of Present Illness: Billy Carr is a 70 y.o. male with history of PAF s/p RF ablation with PVI on 10/30/2013 with repeat ablation 05/22/2014, typical atrial flutter, SVT, atrial tachycardia with variable block, orthostatic intolerance, and syncope who presents for evaluation of increased Afib burden/palpiations.   In 2012, an event recorder was negative; a stress test demonstrated normal left ventricular function with 90% achieved maximal heart rate and no ischemia. He was seen Feb 2013 with tachy palpitations and was found to be in atrial flutter which terminated spontaneously couple of days afterwards. At that time there was a discussion regarding possible ablation; but he was having nocturnal tachy palpitations and an event recorder was ordered>> atrial fib with spells occurring in clusters the longest of which was about an hour and a half with an average heart rate of about 110 at peak heart rates in the 160s. [facility] after he had 3 syncopal episodes all of them were sudden. He was noted to be in atrial flutter and mildly tachycardic by EMS. He was later noted to have atrial fibrillation on telemetry. It was felt that the syncope might have been related to post termination pulses or 1-1 conduction with atrial flutter. CT head was unremarkable. Carotid duplex showed no significant disease. He converted to sinus rhythm with metoprolol and was started on flecainide 50 mg twice daily as well as Xarelto for anticoagulation. He was previously on flecainide in 2014, followed by Tikosyn in 12/2013. Upon discontinuation he noted increaed Afib burden. He was last seen in clinic on 08/09/2013 and was doing well at that time. [facility]. [facility] showed  normal EF, mild MR, ASD, aortic sclerosis. [facility]/2015 he noted no symptoms concerning for Afib since 12/2013. Outpatient monitoring 3-01/2015 showed no Afib, with underlying predominat rhythm of sinus, runs of SVT with a max HR of 245 bpm and avg HR of 119 bpm. Atrial tachycardia with variable block. [facility].   He called the office on 8/8 with complaints of increased palpitations c/w prior episodes of Afib and a BP in the 140's systolic.   He tells me this morning he has been experiencing increased palpitations characterized as a "fish flopping" in his chest for the past 3 weeks, intermittently. Symptoms are becoming more frequent over the past 1 week. They are not associated with exertion. Feels similar to his prior Afib episodes. No recent illnesses, trauma, or increased pain. He is under increased stress as of late which has precluded his ability to continue to run. He still plays basketball and golf regularly. No increased SOB, LE edema, orthopnea, or early satiety. He also mentions an intermittent left-sided, nonexertional chest pain over the past couple of weeks. No pain currently. He has not missed any doses of his Eliquis.    Past Medical History:  Diagnosis Date  . Atrial flutter/fib 10/27/2011   flecainide::CHADSVASc--1>> no anticoag 2/14  . Atrial tachycardia (HCC)   . Bulging disc   . History of shingles   . Nephrolithiasis   . PVC (premature ventricular contraction)    asymptomatic  . Syncope    Neurally mediated-presumed::also ?assoc with AFib  .  Vertical nystagmus     Past Surgical History:  Procedure Laterality Date  . HERNIA REPAIR     bilateral  . NEPHROSTOMY     open, left kidney stone    Current Outpatient Prescriptions  Medication Sig Dispense Refill  . apixaban (ELIQUIS) 5 MG TABS tablet TAKE 1 TABLET(5 MG) BY MOUTH TWICE DAILY    . metoprolol succinate (TOPROL-XL) 12.5 mg TB24 24 hr tablet  Take 0.5 tablets (12.5 mg total) by mouth every 12 (twelve) hours. 60 tablet 3  . Multiple Vitamin (MULTIVITAMIN) tablet Take 1 tablet by mouth daily.      . vitamin C (ASCORBIC ACID) 500 MG tablet Take 500 mg by mouth daily.     No current facility-administered medications for this visit.     Allergies:   Review of patient's allergies indicates no known allergies.   Social History:  The patient  reports that he has never smoked. He has never used smokeless tobacco. He reports that he does not drink alcohol or use drugs.   Family History:  The patient's family history includes Heart failure in his mother.  ROS:   Review of Systems  Constitutional: Positive for malaise/fatigue. Negative for chills, diaphoresis, fever and weight loss.  HENT: Negative for congestion.   Eyes: Negative for discharge and redness.  Respiratory: Negative for cough, sputum production, shortness of breath and wheezing.   Cardiovascular: Positive for palpitations. Negative for chest pain, orthopnea, claudication, leg swelling and PND.  Gastrointestinal: Negative for abdominal pain, heartburn, nausea and vomiting.  Musculoskeletal: Negative for back pain, falls, joint pain, myalgias and neck pain.  Skin: Negative for rash.  Neurological: Negative for dizziness, tingling, tremors, sensory change, speech change, focal weakness, loss of consciousness and weakness.  Endo/Heme/Allergies: Does not bruise/bleed easily.  Psychiatric/Behavioral: Negative for substance abuse. The patient is not nervous/anxious.   All other systems reviewed and are negative.    PHYSICAL EXAM:  VS:  BP 100/60 (BP Location: Left Arm, Patient Position: Sitting, Cuff Size: Normal)   Pulse 72   Ht 6' (1.829 m)   Wt 204 lb (92.5 kg)   BMI 27.67 kg/m  BMI: Body mass index is 27.67 kg/m.  Physical Exam  Constitutional: He is oriented to person, place, and time. He appears well-developed and well-nourished.  HENT:  Head: Normocephalic and  atraumatic.  Eyes: Right eye exhibits no discharge. Left eye exhibits no discharge.  Neck: Normal range of motion. No JVD present.  Cardiovascular: Normal rate, S1 normal, S2 normal and normal heart sounds.  An irregular rhythm present. Exam reveals no distant heart sounds, no friction rub, no midsystolic click and no opening snap.   No murmur heard. Pulmonary/Chest: Effort normal and breath sounds normal. No respiratory distress. He has no decreased breath sounds. He has no wheezes. He has no rales. He exhibits no tenderness.  Abdominal: Soft. He exhibits no distension. There is no tenderness.  Musculoskeletal: He exhibits no edema.  Neurological: He is alert and oriented to person, place, and time.  Skin: Skin is warm and dry. No cyanosis. Nails show no clubbing.  Psychiatric: He has a normal mood and affect. His speech is normal and behavior is normal. Judgment and thought content normal.     EKG:  Was ordered and interpreted by me today. Shows NSR with sinus arrhythmia, 70 bpm, left anterior fascicular block, no acute st/t changes   Recent Labs: No results found for requested labs within last 8760 hours.  No results found for requested  labs within last 8760 hours.   CrCl cannot be calculated (Patient's most recent lab result is older than the maximum 21 days allowed.).   Wt Readings from Last 3 Encounters:  05/25/16 204 lb (92.5 kg)  08/09/13 215 lb 8 oz (97.8 kg)  03/07/13 220 lb 4 oz (99.9 kg)     Other studies reviewed: Additional studies/records reviewed today include: summarized above  ASSESSMENT AND PLAN:  1. Palpitations/PAF s/p ablation x 2, typical atrial flutter, SVT, atrial tachycardia: Symptoms are similar to prior episodes of Afib. He reported them this morning, though 12-lead shows sinus rhythm with occasional PAC. Check 48-hour Holter monitor to evaluate for increased Afib burden/ectopy. Check CBC, BMET, TSH, Magnesium. Start short-acting diltiazem 30 mg q 8 hours  prn palpitations. Continue Toprol XL 12.5 mg bid. Continue Eliquis 5 mg bid per primary cardiologist. CHADS2VASc at least 1 (age x 1). Consider referral to Afib clinic if increased Afib burden is noted.   2. Chest pain with moderate risk of cardiac etiology: Schedule Lexiscan Myoview to evaluate for high-risk ischemia. On Eliquis in place of aspirin.   3. Mitral regurgitation: Check echo.   4. Orthostatic hypotension: Stable.   5. History of syncope: None recently. Follow.   Disposition: F/u with me in 2-3 weeks.   Current medicines are reviewed at length with the patient today.  The patient did not have any concerns regarding medicines.  Elinor Dodge PA-C 05/25/2016 8:41 AM     [facility] [place] [address] [place], [place] [address] [phone]

## 2016-05-24 NOTE — Telephone Encounter (Signed)
S/w pt who reports he has been in afib off and on for approximately 3 weeks. He has had 2 ablations with the most recent being 2 years ago. Everything has been normal until a few weeks ago. He checked his pulse and recognized he was in afib.  He had difficulty sleeping last evening as he was concerned. BP 145/83, which patient states is high for him as it typically runs 110/65. He last saw Dr. Kirke CorinArida Oct 2014 and requests appt. He is agreeable to 8/9 @ 8am with Eula Listenyan Dunn, PA-C. He understands if sx were to worsen, he should proceed to ER. Pt verbalized understanding and is agreeable w/plan.

## 2016-05-24 NOTE — Telephone Encounter (Signed)
Pt states he has been in afib on an off for 3 weeks. States if was worse last night. States his BP last night was 145/83. Please call. Pt did schedule for tomorrow 8/9, with Eula Listenyan Dunn, PA.

## 2016-05-25 ENCOUNTER — Encounter: Payer: Self-pay | Admitting: Physician Assistant

## 2016-05-25 ENCOUNTER — Ambulatory Visit (INDEPENDENT_AMBULATORY_CARE_PROVIDER_SITE_OTHER): Payer: Medicare HMO | Admitting: Physician Assistant

## 2016-05-25 VITALS — BP 100/60 | HR 72 | Ht 72.0 in | Wt 204.0 lb

## 2016-05-25 DIAGNOSIS — I480 Paroxysmal atrial fibrillation: Secondary | ICD-10-CM

## 2016-05-25 DIAGNOSIS — I951 Orthostatic hypotension: Secondary | ICD-10-CM

## 2016-05-25 DIAGNOSIS — R079 Chest pain, unspecified: Secondary | ICD-10-CM | POA: Diagnosis not present

## 2016-05-25 DIAGNOSIS — Z9189 Other specified personal risk factors, not elsewhere classified: Secondary | ICD-10-CM

## 2016-05-25 DIAGNOSIS — Z87898 Personal history of other specified conditions: Secondary | ICD-10-CM

## 2016-05-25 DIAGNOSIS — I340 Nonrheumatic mitral (valve) insufficiency: Secondary | ICD-10-CM

## 2016-05-25 MED ORDER — DILTIAZEM HCL 30 MG PO TABS: 30.0000 mg | ORAL_TABLET | Freq: Three times a day (TID) | ORAL | 1 refills | Status: DC | PRN

## 2016-05-25 MED ORDER — DILTIAZEM HCL ER COATED BEADS 120 MG PO CP24
120.0000 mg | ORAL_CAPSULE | Freq: Every day | ORAL | 3 refills | Status: DC
Start: 2016-05-25 — End: 2016-05-25

## 2016-05-25 NOTE — Patient Instructions (Addendum)
Medication Instructions:  Your physician has recommended you make the following change in your medication:  START taking diltiazem 30mg  three times daily as needed    Labwork: BMET, CBC, magnesium, TSH  Testing/Procedures: Your physician has recommended that you wear a holter monitor. Holter monitors are medical devices that record the heart's electrical activity. Doctors most often use these monitors to diagnose arrhythmias. Arrhythmias are problems with the speed or rhythm of the heartbeat. The monitor is a small, portable device. You can wear one while you do your normal daily activities. This is usually used to diagnose what is causing palpitations/syncope (passing out).  Your physician has requested that you have an echocardiogram. Echocardiography is a painless test that uses sound waves to create images of your heart. It provides your doctor with information about the size and shape of your heart and how well your heart's chambers and valves are working. This procedure takes approximately one hour. There are no restrictions for this procedure.  Your physician has requested that you have a lexiscan myoview. For further information please visit [email or web address] Please follow instruction sheet, as given.  [facility] MYOVIEW  Your caregiver has ordered a Stress Test with nuclear imaging. The purpose of this test is to evaluate the blood supply to your heart muscle. This procedure is referred to as a "Non-Invasive Stress Test." This is because other than having an IV started in your vein, nothing is inserted or "invades" your body. Cardiac stress tests are done to find areas of poor blood flow to the heart by determining the extent of coronary artery disease (CAD). Some patients exercise on a treadmill, which naturally increases the blood flow to your heart, while others who are  unable to walk on a treadmill due to physical limitations have a pharmacologic/chemical stress agent called Lexiscan .  This medicine will mimic walking on a treadmill by temporarily increasing your coronary blood flow.   Please note: these test may take anywhere between 2-4 hours to complete  PLEASE REPORT TO [facility] M[facility] ENTRANCE  THE VOLUNTEERS AT THE FIRST DESK WILL DIRECT YOU WHERE TO GO  Date of Procedure:_____________________________________  Arrival Time for Procedure:______________________________  Instructions regarding medication:   ____ : Hold diabetes medication morning of procedure  ____:  Hold betablocker(s) night before procedure and morning of procedure  ____:  Hold other medications as follows:_________________________________________________________________________________________________________________________________________________________________________________________________________________________________________________________________________________________  PLEASE NOTIFY THE OFFICE AT LEAST 24 HOURS IN ADVANCE IF YOU ARE UNABLE TO KEEP YOUR APPOINTMENT.  [phone] AND  PLEASE NOTIFY NUCLEAR MEDICINE AT [facility] AT LEAST 24 HOURS IN ADVANCE IF YOU ARE UNABLE TO KEEP YOUR APPOINTMENT. [phone]  How to prepare for your Myoview test:  1. Do not eat or drink after midnight 2. No caffeine for 24 hours prior to test 3. No smoking 24 hours prior to test. 4. Your medication may be taken with water.  If your doctor stopped a medication because of this test, do not take that medication. 5. Ladies, please do not wear dresses.  Skirts or pants are appropriate. Please wear a short sleeve shirt. 6. No perfume, cologne or lotion. 7. Wear comfortable walking shoes. No heels!            Follow-Up: Your physician recommends that you schedule a follow-up appointment after your tests.    Any Other Special Instructions Will Be Listed Below (If Applicable).     If you need a refill on your cardiac medications before your next appointment, please call your  pharmacy.  Echocardiogram  An echocardiogram, or echocardiography, uses sound waves (ultrasound) to produce an image of your heart. The echocardiogram is simple, painless, obtained within a short period of time, and offers valuable information to your health care provider. The images from an echocardiogram can provide information such as:  Evidence of coronary artery disease (CAD).  Heart size.  Heart muscle function.  Heart valve function.  Aneurysm detection.  Evidence of a past heart attack.  Fluid buildup around the heart.  Heart muscle thickening.  Assess heart valve function. LET [facility] CARE PROVIDER KNOW ABOUT:  Any allergies you have.  All medicines you are taking, including vitamins, herbs, eye drops, creams, and over-the-counter medicines.  Previous problems you or members of your family have had with the use of anesthetics.  Any blood disorders you have.  Previous surgeries you have had.  Medical conditions you have.  Possibility of pregnancy, if this applies. BEFORE THE PROCEDURE  No special preparation is needed. Eat and drink normally.  PROCEDURE   In order to produce an image of your heart, gel will be applied to your chest and a wand-like tool (transducer) will be moved over your chest. The gel will help transmit the sound waves from the transducer. The sound waves will harmlessly bounce off your heart to allow the heart images to be captured in real-time motion. These images will then be recorded.  You may need an IV to receive a medicine that improves the quality of the pictures. AFTER THE PROCEDURE You may return to your normal schedule including diet, activities, and medicines, unless your health care provider tells you otherwise.   This information is not intended to replace advice given to you by your health care provider. Make sure you discuss any questions you have with your health care provider.   Document Released: 09/30/2000 Document  Revised: 10/24/2014 Document Reviewed: 06/10/2013 Elsevier Interactive Patient Education 2016 Elsevier Inc. Cardiac Nuclear Scanning A cardiac nuclear scan is used to check your heart for problems, such as the following:  A portion of the heart is not getting enough blood.  Part of the heart muscle has died, which happens with a heart attack.  The heart wall is not working normally.  In this test, a radioactive dye (tracer) is injected into your bloodstream. After the tracer has traveled to your heart, a scanning device is used to measure how much of the tracer is absorbed by or distributed to various areas of your heart. LET [facility] CARE PROVIDER KNOW ABOUT:  Any allergies you have.  All medicines you are taking, including vitamins, herbs, eye drops, creams, and over-the-counter medicines.  Previous problems you or members of your family have had with the use of anesthetics.  Any blood disorders you have.  Previous surgeries you have had.  Medical conditions you have.  RISKS AND COMPLICATIONS Generally, this is a safe procedure. However, as with any procedure, problems can occur. Possible problems include:   Serious chest pain.  Rapid heartbeat.  Sensation of warmth in your chest. This usually passes quickly. BEFORE THE PROCEDURE Ask your health care provider about changing or stopping your regular medicines. PROCEDURE This procedure is usually done at a hospital and takes 2-4 hours.  An IV tube is inserted into one of your veins.  Your health care provider will inject a small amount of radioactive tracer through the tube.  You will then wait for 20-40 minutes while the tracer travels through your bloodstream.  You will lie down on an  exam table so images of your heart can be taken. Images will be taken for about 15-20 minutes.  You will exercise on a treadmill or stationary bike. While you exercise, your heart activity will be monitored with an electrocardiogram  (ECG), and your blood pressure will be checked.  If you are unable to exercise, you may be given a medicine to make your heart beat faster.  When blood flow to your heart has peaked, tracer will again be injected through the IV tube.  After 20-40 minutes, you will get back on the exam table and have more images taken of your heart.  When the procedure is over, your IV tube will be removed. AFTER THE PROCEDURE  You will likely be able to leave shortly after the test. Unless your health care provider tells you otherwise, you may return to your normal schedule, including diet, activities, and medicines.  Make sure you find out how and when you will get your test results.   This information is not intended to replace advice given to you by your health care provider. Make sure you discuss any questions you have with your health care provider.   Document Released: 10/28/2004 Document Revised: 10/08/2013 Document Reviewed: 09/11/2013 Elsevier Interactive Patient Education 2016 Elsevier Inc.  Holter Monitoring A Holter monitor is a small device that is used to detect abnormal heart rhythms. It clips to your clothing and is connected by wires to flat, sticky disks (electrodes) that attach to your chest. It is worn continuously for 24-48 hours. HOME CARE INSTRUCTIONS  Wear your Holter monitor at all times, even while exercising and sleeping, for as long as directed by your health care provider.  Make sure that the Holter monitor is safely clipped to your clothing or close to your body as recommended by your health care provider.  Do not get the monitor or wires wet.  Do not put body lotion or moisturizer on your chest.  Keep your skin clean.  Keep a diary of your daily activities, such as walking and doing chores. If you feel that your heartbeat is abnormal or that your heart is fluttering or skipping a beat:  Record what you are doing when it happens.  Record what time of day the  symptoms occur.  Return your Holter monitor as directed by your health care provider.  Keep all follow-up visits as directed by your health care provider. This is important. SEEK IMMEDIATE MEDICAL CARE IF:  You feel lightheaded or you faint.  You have trouble breathing.  You feel pain in your chest, upper arm, or jaw.  You feel sick to your stomach and your skin is pale, cool, or damp.  You heartbeat feels unusual or abnormal.   This information is not intended to replace advice given to you by your health care provider. Make sure you discuss any questions you have with your health care provider.   Document Released: 07/01/2004 Document Revised: 10/24/2014 Document Reviewed: 05/12/2014 Elsevier Interactive Patient Education [employer] Elsevier Inc.

## 2016-05-26 LAB — CBC
HEMATOCRIT: 46 % (ref 37.5–51.0)
HEMOGLOBIN: 14.9 g/dL (ref 12.6–17.7)
MCH: 29.2 pg (ref 26.6–33.0)
MCHC: 32.4 g/dL (ref 31.5–35.7)
MCV: 90 fL (ref 79–97)
Platelets: 195 10*3/uL (ref 150–379)
RBC: 5.1 x10E6/uL (ref 4.14–5.80)
RDW: 13.2 % (ref 12.3–15.4)
WBC: 7.3 10*3/uL (ref 3.4–10.8)

## 2016-05-26 LAB — BASIC METABOLIC PANEL
BUN/Creatinine Ratio: 21 (ref 10–24)
BUN: 21 mg/dL (ref 8–27)
CALCIUM: 9.6 mg/dL (ref 8.6–10.2)
CHLORIDE: 98 mmol/L (ref 96–106)
CO2: 26 mmol/L (ref 18–29)
Creatinine, Ser: 0.99 mg/dL (ref 0.76–1.27)
GFR calc non Af Amer: 77 mL/min/{1.73_m2} (ref >59)
GFR, EST AFRICAN AMERICAN: 89 mL/min/{1.73_m2} (ref >59)
Glucose: 118 mg/dL — ABNORMAL HIGH (ref 65–99)
POTASSIUM: 4.7 mmol/L (ref 3.5–5.2)
Sodium: 140 mmol/L (ref 134–144)

## 2016-05-26 LAB — MAGNESIUM: MAGNESIUM: 2.4 mg/dL — AB (ref 1.6–2.3)

## 2016-05-26 LAB — TSH: TSH: 1 u[IU]/mL (ref 0.450–4.500)

## 2016-05-27 ENCOUNTER — Telehealth: Payer: Self-pay | Admitting: Physician Assistant

## 2016-05-27 NOTE — Telephone Encounter (Signed)
Left detailed message regarding treadmill myoview instructions on pt home VM. Provided CB number if questions.

## 2016-05-30 ENCOUNTER — Encounter
Admission: RE | Admit: 2016-05-30 | Discharge: 2016-05-30 | Disposition: A | Discharge status: Home / Self Care | Payer: Medicare HMO | Source: Ambulatory Visit | Attending: Physician Assistant | Admitting: Physician Assistant

## 2016-05-30 DIAGNOSIS — R079 Chest pain, unspecified: Secondary | ICD-10-CM | POA: Diagnosis not present

## 2016-05-30 LAB — NM MYOCAR MULTI W/SPECT W/WALL MOTION / EF
CHL CUP NUCLEAR SRS: 0
CHL CUP STRESS STAGE 1 GRADE: 10 %
CHL CUP STRESS STAGE 1 HR: 88 {beats}/min
CHL CUP STRESS STAGE 2 DBP: 77 mmHg
CHL CUP STRESS STAGE 2 GRADE: 12 %
CHL CUP STRESS STAGE 2 HR: 102 {beats}/min
CHL CUP STRESS STAGE 3 DBP: 73 mmHg
CHL CUP STRESS STAGE 3 HR: 122 {beats}/min
CHL CUP STRESS STAGE 3 SBP: 156 mmHg
CHL CUP STRESS STAGE 5 GRADE: 0 %
CHL CUP STRESS STAGE 6 DBP: 66 mmHg
CHL CUP STRESS STAGE 6 HR: 92 {beats}/min
CHL CUP STRESS STAGE 6 SBP: 129 mmHg
CSEPED: 10 min
CSEPEDS: 27 s
CSEPEW: 12.4 METS
CSEPHR: 92 %
CSEPPHR: 137 {beats}/min
LV dias vol: 72 mL (ref 62–150)
LVSYSVOL: 34 mL
Percent of predicted max HR: 91 %
Rest HR: 78 {beats}/min
SDS: 5
SSS: 2
Stage 1 DBP: 74 mmHg
Stage 1 SBP: 134 mmHg
Stage 1 Speed: 1.7 mph
Stage 2 SBP: 139 mmHg
Stage 2 Speed: 2.5 mph
Stage 3 Grade: 14 %
Stage 3 Speed: 3.4 mph
Stage 4 Grade: 16 %
Stage 4 HR: 137 {beats}/min
Stage 4 Speed: 4.2 mph
Stage 5 HR: 122 {beats}/min
Stage 5 Speed: 0 mph
Stage 6 Grade: 0 %
Stage 6 Speed: 0 mph
TID: 0.89

## 2016-05-30 MED ORDER — TECHNETIUM TC 99M TETROFOSMIN IV KIT
13.0000 | PACK | Freq: Once | INTRAVENOUS | Status: AC | PRN
  Administered 2016-05-30: 12.361 via INTRAVENOUS

## 2016-05-30 MED ORDER — TECHNETIUM TC 99M TETROFOSMIN IV KIT
29.9100 | PACK | Freq: Once | INTRAVENOUS | Status: AC | PRN
Start: 2016-05-30 — End: 2016-05-30
  Administered 2016-05-30: 29.91 via INTRAVENOUS

## 2016-06-10 ENCOUNTER — Other Ambulatory Visit: Payer: Commercial Managed Care - HMO

## 2016-06-17 ENCOUNTER — Ambulatory Visit (INDEPENDENT_AMBULATORY_CARE_PROVIDER_SITE_OTHER): Payer: Medicare HMO

## 2016-06-17 ENCOUNTER — Other Ambulatory Visit: Payer: Self-pay

## 2016-06-17 DIAGNOSIS — I480 Paroxysmal atrial fibrillation: Secondary | ICD-10-CM | POA: Diagnosis not present

## 2016-06-17 DIAGNOSIS — I340 Nonrheumatic mitral (valve) insufficiency: Secondary | ICD-10-CM

## 2016-06-23 ENCOUNTER — Encounter: Payer: Self-pay | Admitting: Cardiovascular Disease

## 2016-06-23 ENCOUNTER — Ambulatory Visit (INDEPENDENT_AMBULATORY_CARE_PROVIDER_SITE_OTHER): Payer: Medicare HMO | Admitting: Cardiovascular Disease

## 2016-06-23 VITALS — BP 112/72 | HR 72 | Ht 72.0 in | Wt 205.5 lb

## 2016-06-23 DIAGNOSIS — I4892 Unspecified atrial flutter: Secondary | ICD-10-CM | POA: Diagnosis not present

## 2016-06-23 DIAGNOSIS — I471 Supraventricular tachycardia: Secondary | ICD-10-CM | POA: Diagnosis not present

## 2016-06-23 NOTE — Patient Instructions (Signed)
Medication Instructions: Continue same medications.   Labwork: None.   Procedures/Testing: None.   Follow-Up: 6 months with Dr. Arida.   Any Additional Special Instructions Will Be Listed Below (If Applicable).     If you need a refill on your cardiac medications before your next appointment, please call your pharmacy.   

## 2016-06-23 NOTE — Progress Notes (Signed)
Cardiology Office Note   Date:  06/23/2016   ID:  Billy Carr, DOB 12/04/1945, MRN [id number]  PCP:  Delton Prairieobin, Paul, MD  Cardiologist:   Lorine BearsMuhammad Venson Ferencz, MD   Chief Complaint  Patient presents with  . Other    F/u echo/myoview. Pt is aware that monitor was turned in yesterday and will be contacted once results are recieved. Meds reviewd verbally with pt.      History of Present Illness: Billy BastaWalter J Foister is a 70 y.o. male who presents for a follow-up visit Regarding persistent atrial fibrillation status post RF [facility] 2015 and [facility]. He was seen recently by Alycia Rossettiyan for increased palpitations. He had some atypical chest pain. He underwent a treadmill nuclear stress test which showed no evidence of ischemia with normal ejection fraction. Echocardiogram showed normal LV systolic function, mild mitral regurgitation and mildly dilated aortic root at 39 mm.  A Holter monitor was done but still not interpreted. He reports feeling better overall. He thinks the palpitations were triggered by stress and anxiety but he is feeling better now. No recurrent chest pain or shortness of breath. He was given diltiazem short-acting to be used as needed but he has not required this.  Past Medical History:  Diagnosis Date  . Atrial flutter/fib 10/27/2011   flecainide::CHADSVASc--1>> no anticoag 2/14  . Atrial tachycardia (HCC)   . Bulging disc   . History of shingles   . Nephrolithiasis   . PVC (premature ventricular contraction)    asymptomatic  . Syncope    Neurally mediated-presumed::also ?assoc with AFib  . Vertical nystagmus     Past Surgical History:  Procedure Laterality Date  . HERNIA REPAIR     bilateral  . NEPHROSTOMY     open, left kidney stone  . SQUAMOUS CELL CARCINOMA EXCISION     Head     Current Outpatient Prescriptions  Medication Sig Dispense Refill  . apixaban (ELIQUIS) 5 MG TABS tablet TAKE 1 TABLET(5 MG) BY MOUTH TWICE DAILY    . diltiazem (CARDIZEM) 30 MG  tablet Take 1 tablet (30 mg total) by mouth 3 (three) times daily as needed. 90 tablet 1  . metoprolol succinate (TOPROL-XL) 12.5 mg TB24 24 hr tablet Take 0.5 tablets (12.5 mg total) by mouth every 12 (twelve) hours. 60 tablet 3  . Multiple Vitamin (MULTIVITAMIN) tablet Take 1 tablet by mouth daily.      . vitamin C (ASCORBIC ACID) 500 MG tablet Take 500 mg by mouth daily.     No current facility-administered medications for this visit.     Allergies:   Review of patient's allergies indicates no known allergies.    Social History:  The patient  reports that he has never smoked. He has never used smokeless tobacco. He reports that he does not drink alcohol or use drugs.   Family History:  The patient's family history includes Heart failure in his mother.    ROS:  Please see the history of present illness.   Otherwise, review of systems are positive for none.   All other systems are reviewed and negative.    PHYSICAL EXAM: VS:  BP 112/72 (BP Location: Left Arm, Patient Position: Sitting, Cuff Size: Normal)   Pulse 72   Ht 6' (1.829 m)   Wt 205 lb 8 oz (93.2 kg)   BMI 27.87 kg/m  , BMI Body mass index is 27.87 kg/m. GEN: Well nourished, well developed, in no acute distress  HEENT: normal  Neck: no JVD, carotid bruits, or masses Cardiac: RRR with premature beats ; no murmurs, rubs, or gallops,no edema  Respiratory:  clear to auscultation bilaterally, normal work of breathing GI: soft, nontender, nondistended, + BS MS: no deformity or atrophy  Skin: warm and dry, no rash Neuro:  Strength and sensation are intact Psych: euthymic mood, full affect   EKG:  EKG is ordered today. The ekg ordered today demonstrates sinus rhythm with sinus arrhythmia, left anterior fascicular block.   Recent Labs: 05/25/2016: BUN 21; Creatinine, Ser 0.99; Magnesium 2.4; Platelets 195; Potassium 4.7; Sodium 140; TSH 1.000    Lipid Panel    Component Value Date/Time   CHOL 130 08/01/2012 0036    TRIG 77 08/01/2012 0036   HDL 34 (L) 08/01/2012 0036   CHOLHDL 4.9 12/14/2010 0334   VLDL 15 08/01/2012 0036   LDLCALC 81 08/01/2012 0036      Wt Readings from Last 3 Encounters:  06/23/16 205 lb 8 oz (93.2 kg)  05/25/16 204 lb (92.5 kg)  08/09/13 215 lb 8 oz (97.8 kg)       No flowsheet data found.    ASSESSMENT AND PLAN:  1.  Atrial fibrillation status post RF ablation: Recently he had recurrent palpitations but I doubt that there was atrial fibrillation. Most likely had premature beats but will await the results of Holter monitor. His echocardiogram and stress test were both reassuring. Also his symptoms have improved. He is only on small dose Toprol. Continue same management. He is on long-term anticoagulation with Eliquis.  2. Atypical chest pain: Resolved completely. Nuclear stress test was normal.  3. History of orthostatic hypotension, stable.   Disposition:   FU with me in 6 months  Signed,  Lorine Bears, MD  06/23/2016 1:23 PM    [facility]

## 2016-06-27 ENCOUNTER — Ambulatory Visit
Admission: RE | Admit: 2016-06-27 | Discharge: 2016-06-27 | Disposition: A | Discharge status: Home / Self Care | Payer: Commercial Managed Care - HMO | Source: Ambulatory Visit | Attending: Cardiovascular Disease | Admitting: Cardiovascular Disease

## 2016-06-27 DIAGNOSIS — I480 Paroxysmal atrial fibrillation: Secondary | ICD-10-CM | POA: Insufficient documentation

## 2016-09-19 ENCOUNTER — Telehealth: Payer: Self-pay

## 2016-09-19 NOTE — Telephone Encounter (Signed)
Gastroenterology Pre-Procedure Review  Request Date: 10/03/16 Requesting Physician: Dr. Servando SnareWohl  PATIENT REVIEW QUESTIONS: The patient responded to the following health history questions as indicated:    1. Are you having any GI issues? no 2. Do you have a personal history of Polyps? no 3. Do you have a family history of Colon Cancer or Polyps? no 4. Diabetes Mellitus? no 5. Joint replacements in the past 12 months?no 6. Major health problems in the past 3 months?no 7. Any artificial heart valves, MVP, or defibrillator?no    MEDICATIONS & ALLERGIES:    Patient reports the following regarding taking any anticoagulation/antiplatelet therapy:   Plavix, Coumadin, Eliquis, Xarelto, Lovenox, Pradaxa, Brilinta, or Effient? yes (Eliquis) Aspirin? no  Patient confirms/reports the following medications:  Current Outpatient Prescriptions  Medication Sig Dispense Refill  . apixaban (ELIQUIS) 5 MG TABS tablet TAKE 1 TABLET(5 MG) BY MOUTH TWICE DAILY    . diltiazem (CARDIZEM) 30 MG tablet Take 1 tablet (30 mg total) by mouth 3 (three) times daily as needed. 90 tablet 1  . metoprolol succinate (TOPROL-XL) 12.5 mg TB24 24 hr tablet Take 0.5 tablets (12.5 mg total) by mouth every 12 (twelve) hours. 60 tablet 3  . Multiple Vitamin (MULTIVITAMIN) tablet Take 1 tablet by mouth daily.      . vitamin C (ASCORBIC ACID) 500 MG tablet Take 500 mg by mouth daily.     No current facility-administered medications for this visit.     Patient confirms/reports the following allergies:  No Known Allergies  No orders of the defined types were placed in this encounter.   AUTHORIZATION INFORMATION Primary Insurance: 1D#: Group #:  Secondary Insurance: 1D#: Group #:  SCHEDULE INFORMATION: Date:  Time: Location:

## 2016-09-20 ENCOUNTER — Other Ambulatory Visit: Payer: Self-pay

## 2016-09-20 ENCOUNTER — Telehealth: Payer: Self-pay

## 2016-09-20 DIAGNOSIS — Z1211 Encounter for screening for malignant neoplasm of colon: Secondary | ICD-10-CM

## 2016-09-20 NOTE — Telephone Encounter (Signed)
Insurance was verified.  Paperwork mailed to patient at verified address.

## 2016-09-20 NOTE — Telephone Encounter (Signed)
Orders sent

## 2016-09-28 ENCOUNTER — Other Ambulatory Visit: Payer: Self-pay

## 2016-09-28 ENCOUNTER — Encounter: Payer: Self-pay | Admitting: *Deleted

## 2016-09-29 ENCOUNTER — Telehealth: Payer: Self-pay

## 2016-09-29 NOTE — Telephone Encounter (Signed)
Pt notified today per Dr. Hurman HornMounsey, pt is to stop Eliquis 5mg  2 days prior to colonoscopy and restart 1 day after. Signed blood thinner request for is located in Media.

## 2016-09-30 ENCOUNTER — Telehealth: Payer: Self-pay | Admitting: Gastroenterology

## 2016-09-30 NOTE — Telephone Encounter (Signed)
[facility], patient called and has a question regarding his Eliquis. He has a procedure Monday and needs to be called today. [phone]. Ginger may not be in today.

## 2016-09-30 NOTE — Discharge Instructions (Signed)

## 2016-09-30 NOTE — Telephone Encounter (Signed)
Called patient at this time. Patient is to take Eliquis today. Hold tomorrow and Sunday. Then he is to resume this on Tuesday. Patient verbalizes understanding.

## 2016-10-03 ENCOUNTER — Ambulatory Visit: Payer: Commercial Managed Care - HMO | Admitting: Anesthesiology

## 2016-10-03 ENCOUNTER — Encounter
Admission: RE | Disposition: A | Discharge status: Home / Self Care | Payer: Self-pay | Source: Ambulatory Visit | Attending: Gastroenterology

## 2016-10-03 ENCOUNTER — Ambulatory Visit
Admission: RE | Admit: 2016-10-03 | Discharge: 2016-10-03 | Disposition: A | Discharge status: Home / Self Care | Payer: Commercial Managed Care - HMO | Source: Ambulatory Visit | Attending: Gastroenterology | Admitting: Gastroenterology

## 2016-10-03 DIAGNOSIS — I4891 Unspecified atrial fibrillation: Secondary | ICD-10-CM | POA: Insufficient documentation

## 2016-10-03 DIAGNOSIS — I493 Ventricular premature depolarization: Secondary | ICD-10-CM | POA: Insufficient documentation

## 2016-10-03 DIAGNOSIS — K641 Second degree hemorrhoids: Secondary | ICD-10-CM | POA: Insufficient documentation

## 2016-10-03 DIAGNOSIS — Z79899 Other long term (current) drug therapy: Secondary | ICD-10-CM | POA: Insufficient documentation

## 2016-10-03 DIAGNOSIS — Z1211 Encounter for screening for malignant neoplasm of colon: Secondary | ICD-10-CM

## 2016-10-03 HISTORY — PX: COLONOSCOPY WITH PROPOFOL: SHX5780

## 2016-10-03 HISTORY — DX: Presence of dental prosthetic device (complete) (partial): Z97.2

## 2016-10-03 SURGERY — COLONOSCOPY WITH PROPOFOL
Anesthesia: Monitor Anesthesia Care

## 2016-10-03 MED ORDER — LIDOCAINE HCL (CARDIAC) 20 MG/ML IV SOLN
INTRAVENOUS | Status: DC | PRN
  Administered 2016-10-03: 40 mg via INTRAVENOUS

## 2016-10-03 MED ORDER — LACTATED RINGERS IV SOLN
INTRAVENOUS | Status: DC
  Administered 2016-10-03: 09:00:00 via INTRAVENOUS

## 2016-10-03 MED ORDER — PROPOFOL 10 MG/ML IV BOLUS
INTRAVENOUS | Status: DC | PRN
  Administered 2016-10-03: 50 mg via INTRAVENOUS
  Administered 2016-10-03: 100 mg via INTRAVENOUS
  Administered 2016-10-03: 50 mg via INTRAVENOUS

## 2016-10-03 MED ORDER — ACETAMINOPHEN 325 MG PO TABS: 325.0000 mg | ORAL_TABLET | ORAL | Status: DC | PRN

## 2016-10-03 MED ORDER — GLYCOPYRROLATE 0.2 MG/ML IJ SOLN
INTRAMUSCULAR | Status: DC | PRN
  Administered 2016-10-03: 0.2 mg via INTRAVENOUS

## 2016-10-03 MED ORDER — STERILE WATER FOR IRRIGATION IR SOLN
Status: DC | PRN
  Administered 2016-10-03: 10:00:00

## 2016-10-03 MED ORDER — ACETAMINOPHEN 160 MG/5ML PO SOLN: 325.0000 mg | ORAL | Status: DC | PRN

## 2016-10-03 SURGICAL SUPPLY — 23 items

## 2016-10-03 NOTE — Op Note (Signed)
[facility] Gastroenterology Patient Name: Billy Carr Procedure Date: 10/03/2016 9:18 AM MRN: [id number] Account #: [id number] Date of Birth: 06/22/1946 Admit Type: Outpatient Age: 70 [facility]OM [facility] Gender: Male Note Status: Finalized Procedure:            Colonoscopy Indications:          Screening for colorectal malignant neoplasm Providers:            Midge Miniumarren Moneisha Vosler MD, MD Referring MD:         Georgeann OppenheimPc Tobin, MD (Referring MD) Medicines:            Propofol per Anesthesia Complications:        No immediate complications. Procedure:            Pre-Anesthesia Assessment:                       - Prior to the procedure, a History and Physical was                        performed, and patient medications and allergies were                        reviewed. The patient's tolerance of previous                        anesthesia was also reviewed. The risks and benefits of                        the procedure and the sedation options and risks were                        discussed with the patient. All questions were                        answered, and informed consent was obtained. Prior                        Anticoagulants: The patient has taken no previous                        anticoagulant or antiplatelet agents. ASA Grade                        Assessment: II - A patient with mild systemic disease.                        After reviewing the risks and benefits, the patient was                        deemed in satisfactory condition to undergo the                        procedure.                       After obtaining informed consent, the colonoscope was                        passed under direct vision. Throughout the procedure,  the patient's blood pressure, pulse, and oxygen                        saturations were monitored continuously. The was                        introduced through the anus and advanced to the the          cecum, identified by appendiceal orifice and ileocecal                        valve. The colonoscopy was performed without                        difficulty. The patient tolerated the procedure well.                        The quality of the bowel preparation was excellent. Findings:      The perianal and digital rectal examinations were normal.      Non-bleeding internal hemorrhoids were found during retroflexion. The       hemorrhoids were Grade II (internal hemorrhoids that prolapse but reduce       spontaneously). Impression:           - Non-bleeding internal hemorrhoids.                       - No specimens collected. Recommendation:       - Discharge patient to home.                       - Resume previous diet.                       - Continue present medications.                       - Repeat colonoscopy in 10 years for screening unless                        any change in family history or lower GI problems. Procedure Code(s):    --- Professional ---                       [phone], Colonoscopy, flexible; diagnostic, including                        collection of specimen(s) by brushing or washing, when                        performed (separate procedure) Diagnosis Code(s):    --- Professional ---                       Z12.11, Encounter for screening for malignant neoplasm                        of colon CPT copyright 2016 American Medical Association. All rights reserved. The codes documented in this report are preliminary and upon coder review may  be revised to meet current compliance requirements. Midge Miniumarren Kirin Brandenburger MD, MD 10/03/2016 9:44:00 AM This report has been signed electronically. Number of Addenda: 0 Note Initiated On: 10/03/2016 9:18 AM Scope  Withdrawal Time: 0 hours 6 minutes 48 seconds  Total Procedure Duration: 0 hours 9 minutes 18 seconds       [facility]

## 2016-10-03 NOTE — Anesthesia Procedure Notes (Signed)
Procedure Name: MAC Date/Time: 10/03/2016 9:28 AM Performed by: Maryan RuedWILSON, Billy Glatt M Pre-anesthesia Checklist: Patient identified, Emergency Drugs available, Suction available and Patient being monitored Patient Re-evaluated:Patient Re-evaluated prior to inductionOxygen Delivery Method: Nasal cannula

## 2016-10-03 NOTE — Transfer of Care (Signed)
Immediate Anesthesia Transfer of Care Note  Patient: Billy Carr  Procedure(s) Performed: Procedure(s): COLONOSCOPY WITH PROPOFOL (N/A)  Patient Location: PACU  Anesthesia Type: MAC  Level of Consciousness: awake, alert  and patient cooperative  Airway and Oxygen Therapy: Patient Spontanous Breathing and Patient connected to supplemental oxygen  Post-op Assessment: Post-op Vital signs reviewed, Patient's Cardiovascular Status Stable, Respiratory Function Stable, Patent Airway and No signs of Nausea or vomiting  Post-op Vital Signs: Reviewed and stable  Complications: No apparent anesthesia complications

## 2016-10-03 NOTE — Anesthesia Postprocedure Evaluation (Signed)
Anesthesia Post Note  Patient: Billy Carr  Procedure(s) Performed: Procedure(s) (LRB): COLONOSCOPY WITH PROPOFOL (N/A)  Patient location during evaluation: PACU Anesthesia Type: MAC Level of consciousness: awake and alert Pain management: pain level controlled Vital Signs Assessment: post-procedure vital signs reviewed and stable Respiratory status: spontaneous breathing, nonlabored ventilation, respiratory function stable and patient connected to nasal cannula oxygen Cardiovascular status: stable and blood pressure returned to baseline Anesthetic complications: no    [name], [name]

## 2016-10-03 NOTE — Anesthesia Preprocedure Evaluation (Signed)
Anesthesia Evaluation  Patient identified by MRN, date of birth, ID band Patient awake    Reviewed: Allergy & Precautions, H&P , NPO status , Patient's Chart, lab work & pertinent test results, reviewed documented beta blocker date and time   Airway Mallampati: II  TM Distance: >3 FB Neck ROM: full    Dental  (+) Edentulous Upper, Edentulous Lower Will take dentures out:   Pulmonary neg pulmonary ROS,    Pulmonary exam normal breath sounds clear to auscultation       Cardiovascular Exercise Tolerance: Good + dysrhythmias Atrial Fibrillation  Rhythm:regular Rate:Normal  S/P ablation, now in NSR   Neuro/Psych negative neurological ROS  negative psych ROS   GI/Hepatic negative GI ROS, Neg liver ROS,   Endo/Other  negative endocrine ROS  Renal/GU negative Renal ROS  negative genitourinary   Musculoskeletal   Abdominal   Peds  Hematology negative hematology ROS (+)   Anesthesia Other Findings   Reproductive/Obstetrics negative OB ROS                             Anesthesia Physical Anesthesia Plan  ASA: II  Anesthesia Plan: MAC   Post-op Pain Management:    Induction:   Airway Management Planned:   Additional Equipment:   Intra-op Plan:   Post-operative Plan:   Informed Consent: I have reviewed the patients History and Physical, chart, labs and discussed the procedure including the risks, benefits and alternatives for the proposed anesthesia with the patient or authorized representative who has indicated his/her understanding and acceptance.   Dental Advisory Given  Plan Discussed with: CRNA and Anesthesiologist  Anesthesia Plan Comments:         Anesthesia Quick Evaluation

## 2016-10-03 NOTE — H&P (Signed)
Billy Miniumarren Aime Carreras, MD [facility] [address] [place], [place] [address] Phone: [phone] Fax : [phone]  Primary Care Physician:  Delton PrairiePaul TObin, MD Primary Gastroenterologist:  Dr. Servando SnareWohl  Pre-Procedure History & Physical: HPI:  Billy Carr is a 70 y.o. male is here for a screening colonoscopy.   Past Medical History:  Diagnosis Date  . Atrial flutter/fib 10/27/2011   flecainide::CHADSVASc--1>> no anticoag 2/14  . Atrial tachycardia (HCC)   . 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 History of shingles   . Nephrolithiasis   . PVC (premature ventricular contraction)    asymptomatic  . Syncope    Neurally mediated-presumed::also ?assoc with AFib  . Vertical nystagmus    no recent issues  . Wears dentures    full upper, partial lower    Past Surgical History:  Procedure Laterality Date  . CARDIAC ELECTROPHYSIOLOGY STUDY AND ABLATION  2015, 2016  . HERNIA REPAIR     bilateral  . NEPHROSTOMY     open, left kidney stone  . SQUAMOUS CELL CARCINOMA EXCISION     Head    Prior to Admission medications   Medication Sig Start Date End Date Taking? Authorizing Provider  apixaban (ELIQUIS) 5 MG TABS tablet TAKE 1 TABLET(5 MG) BY MOUTH TWICE DAILY 03/02/16  Yes Historical Provider, MD  metoprolol succinate (TOPROL-XL) 12.5 mg TB24 24 hr tablet Take 0.5 tablets (12.5 mg total) by mouth every 12 (twelve) hours. 10/16/13  Yes Iran OuchMuhammad A Arida, MD  Multiple Vitamin (MULTIVITAMIN) tablet Take 1 tablet by mouth daily.     Yes Historical Provider, MD  vitamin C (ASCORBIC ACID) 500 MG tablet Take 500 mg by mouth daily.   Yes Historical Provider, MD  diltiazem (CARDIZEM) 30 MG tablet Take 1 tablet (30 mg total) by mouth 3 (three) times daily as needed. Patient not taking: Reported on 09/28/2016 05/25/16   Sondra Bargesyan M Dunn, PA-C    Allergies as of 09/20/2016  . (No Known Allergies)    Family History  Problem Relation Age of Onset  . Heart failure Mother     Social History    Social History  . Marital status: Married    Spouse name: N/A  . Number of children: N/A  . Years of education: N/A   Occupational History  . Not on file.   Social History Main Topics  . Smoking status: Never Smoker  . Smokeless tobacco: Never Used  . Alcohol use No  . Drug use: No  . Sexual activity: Not on file   Other Topics Concern  . Not on file   Social History Narrative  . No narrative on file    Review of Systems: See HPI, otherwise negative ROS  Physical Exam: BP 117/85   Pulse 71   Temp 98.4 F (36.9 C)   Ht 6' (1.829 m)   Wt 205 lb (93 kg)   SpO2 95%   BMI 27.80 kg/m  General:   Alert,  pleasant and cooperative in NAD Head:  Normocephalic and atraumatic. Neck:  Supple; no masses or thyromegaly. Lungs:  Clear throughout to auscultation.    Heart:  Regular rate and rhythm. Abdomen:  Soft, nontender and nondistended. Normal bowel sounds, without guarding, and without rebound.   Neurologic:  Alert and  oriented x4;  grossly normal neurologically.  Impression/Plan: Billy Carr is now here to undergo a screening colonoscopy.  Risks, benefits, and alternatives regarding colonoscopy have been reviewed with the patient.  Questions have been  answered.  All parties agreeable.

## 2016-10-04 ENCOUNTER — Encounter: Payer: Self-pay | Admitting: Gastroenterology

## 2017-01-11 ENCOUNTER — Telehealth: Payer: Self-pay | Admitting: Cardiovascular Disease

## 2017-01-11 NOTE — Telephone Encounter (Signed)
Called patient to schedule fu from recall.  Patient recently started seeing a new pcp whom asked about metoprolol and eliquis and if he was supposed to be still taking this medication .  Patient just wants to touch base with nurse / Kirke Corinarida to express question pcp has if it is needed still.    Patient also says his original cardiologist that did ablations in the past in [facility] is the one that started him on these medications.

## 2017-01-12 NOTE — Telephone Encounter (Signed)
Pt takes metoprolol 12.5mg  BID and eliquis 5mg  BID as prescribed and refilled by [facility] Hur[facility]ounsey[facility], who [facility] his [facility]. Pt states his PCP inquired as to why he was taking these medications and encouraged him to ask cardiology if he should continue them. Medications were on pt's med list at every office visit. MD aware. Encouraged pt to continue medications as prescribed and further discuss with Dr. Kirke CorinArida at his May office visit. Pt agreeable with plan.

## 2017-02-02 ENCOUNTER — Other Ambulatory Visit: Payer: Self-pay | Admitting: Family Medicine

## 2017-02-02 DIAGNOSIS — R310 Gross hematuria: Secondary | ICD-10-CM

## 2017-02-02 DIAGNOSIS — R109 Unspecified abdominal pain: Secondary | ICD-10-CM

## 2017-02-03 ENCOUNTER — Ambulatory Visit
Admission: RE | Admit: 2017-02-03 | Discharge: 2017-02-03 | Disposition: A | Discharge status: Home / Self Care | Payer: Medicare HMO | Source: Ambulatory Visit | Attending: Family Medicine | Admitting: Family Medicine

## 2017-02-03 DIAGNOSIS — N132 Hydronephrosis with renal and ureteral calculous obstruction: Secondary | ICD-10-CM | POA: Diagnosis not present

## 2017-02-03 DIAGNOSIS — I700 Atherosclerosis of aorta: Secondary | ICD-10-CM | POA: Diagnosis not present

## 2017-02-03 DIAGNOSIS — R109 Unspecified abdominal pain: Secondary | ICD-10-CM

## 2017-02-03 DIAGNOSIS — R310 Gross hematuria: Secondary | ICD-10-CM | POA: Diagnosis present

## 2017-02-03 MED ORDER — IOPAMIDOL (ISOVUE-370) INJECTION 76%
125.0000 mL | Freq: Once | INTRAVENOUS | Status: AC | PRN
  Administered 2017-02-03: 125 mL via INTRAVENOUS

## 2017-02-08 ENCOUNTER — Ambulatory Visit (INDEPENDENT_AMBULATORY_CARE_PROVIDER_SITE_OTHER): Payer: Medicare HMO | Admitting: Urology

## 2017-02-08 ENCOUNTER — Encounter: Payer: Self-pay | Admitting: Urology

## 2017-02-08 VITALS — BP 118/71 | HR 73 | Ht 72.0 in | Wt 205.1 lb

## 2017-02-08 DIAGNOSIS — R3129 Other microscopic hematuria: Secondary | ICD-10-CM | POA: Diagnosis not present

## 2017-02-08 LAB — URINALYSIS, COMPLETE
BILIRUBIN UA: NEGATIVE
GLUCOSE, UA: NEGATIVE
Ketones, UA: NEGATIVE
Leukocytes, UA: NEGATIVE
Nitrite, UA: NEGATIVE
PROTEIN UA: NEGATIVE
RBC UA: NEGATIVE
Specific Gravity, UA: 1.01 (ref 1.005–1.030)
UUROB: 0.2 mg/dL (ref 0.2–1.0)
pH, UA: 6 (ref 5.0–7.5)

## 2017-02-08 LAB — MICROSCOPIC EXAMINATION
Bacteria, UA: NONE SEEN
Epithelial Cells (non renal): NONE SEEN /hpf (ref 0–10)
WBC UA: NONE SEEN /HPF (ref 0–?)

## 2017-02-08 NOTE — Progress Notes (Signed)
02/08/2017 2:36 PM   Vaughan Basta 10-30-1945 [id number]  Referring provider: Delton Prairie, MD [address] [facility] [place], [place] [address]  No chief complaint on file.   HPI: 71 year old male presented to the urology clinic today for further management and evaluation of a 6 mm left proximal ureteral stone. The patient initially presented to his primary care provider with gross hematuria.  The next day he developed renal colic symptoms ear and a CT scan was then ordered in the urgent care clinic and it was notable for an obstructing left  UPJ stone. The patienthas small punctate stones in the left lower pole as well. He has had intermittent colicky type symptoms without associated nausea or vomiting. He has not had any fevers or chills. The patient takes Eliquis for his atrial fibrillation which was stopped on Friday and since then his hematuria has cleared.  The patient has a history of nephrolithiasis, and was treated with open ureterolithotomy in the  Early 19 eighties. He has not had any stones since that time.  The patient has a past medical history significant for atrial fibrillation and has had 2cardiac ablations. He has bein sinus rhythm for over one year, but remained on anticoagulation for preventative measures.  The patient is otherwise very healthy, run several miles a day, plays basketball, and golf.   PMH: Past Medical History:  Diagnosis Date  . Atrial flutter/fib 10/27/2011   flecainide::CHADSVASc--1>> no anticoag 2/14  . Atrial tachycardia (HCC)   . 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 History of shingles   . Nephrolithiasis   . PVC (premature ventricular contraction)    asymptomatic  . Syncope    Neurally mediated-presumed::also ?assoc with AFib  . Vertical nystagmus    no recent issues  . Wears dentures    full upper, partial lower    Surgical History: Past Surgical History:  Procedure Laterality Date  . CARDIAC  ELECTROPHYSIOLOGY STUDY AND ABLATION  2015, 2016  . COLONOSCOPY WITH PROPOFOL N/A 10/03/2016   Procedure: COLONOSCOPY WITH PROPOFOL;  Surgeon: Midge Minium, MD;  Location: [facility];  Service: Endoscopy;  Laterality: N/A;  . HERNIA REPAIR     bilateral  . NEPHROSTOMY     open, left kidney stone  . SQUAMOUS CELL CARCINOMA EXCISION     Head    Home Medications:  Allergies as of 02/08/2017   No Known Allergies     Medication List       Accurate as of 02/08/17  2:36 PM. Always use your most recent med list.          ELIQUIS 5 MG Tabs tablet Generic drug:  apixaban TAKE 1 TABLET(5 MG) BY MOUTH TWICE DAILY   metoprolol succinate 12.5 mg Tb24 24 hr tablet Commonly known as:  TOPROL-XL Take 0.5 tablets (12.5 mg total) by mouth every 12 (twelve) hours.   multivitamin tablet Take 1 tablet by mouth daily.   tamsulosin 0.4 MG Caps capsule Commonly known as:  FLOMAX Take 1 capsule by mouth daily.   vitamin C 500 MG tablet Commonly known as:  ASCORBIC ACID Take 500 mg by mouth daily.       Allergies: No Known Allergies  Family History: Family History  Problem Relation Age of Onset  . Heart failure Mother     Social History:  reports that he has never smoked. He has never used smokeless tobacco. He reports that he does not drink alcohol or use  drugs.  ROS: UROLOGY Frequent Urination?: No Hard to postpone urination?: No Burning/pain with urination?: No Get up at night to urinate?: Yes Leakage of urine?: No Urine stream starts and stops?: No Trouble starting stream?: No Do you have to strain to urinate?: No Blood in urine?: Yes Urinary tract infection?: No Sexually transmitted disease?: No Injury to kidneys or bladder?: No Painful intercourse?: No Weak stream?: No Erection problems?: Yes Penile pain?: No  Gastrointestinal Nausea?: No Vomiting?: No Indigestion/heartburn?: No Diarrhea?: No Constipation?: No  Constitutional Fever: No Night  sweats?: No Weight loss?: No Fatigue?: No  Skin Skin rash/lesions?: No Itching?: No  Eyes Blurred vision?: No Double vision?: No  Ears/Nose/Throat Sore throat?: No Sinus problems?: No  Hematologic/Lymphatic Swollen glands?: No Easy bruising?: No  Cardiovascular Leg swelling?: No Chest pain?: No  Respiratory Cough?: No Shortness of breath?: No  Endocrine Excessive thirst?: No  Musculoskeletal Back pain?: No Joint pain?: No  Neurological Headaches?: No Dizziness?: No  Psychologic Depression?: No Anxiety?: No  Physical Exam: BP 118/71   Pulse 73   Ht 6' (1.829 m)   Wt 93 kg (205 lb 1.6 oz)   BMI 27.82 kg/m   Constitutional:  Alert and oriented, No acute distress. HEENT: [place] AT, moist mucus membranes.  Trachea midline, no masses. Cardiovascular: No clubbing, cyanosis, or edema. Respiratory: Normal respiratory effort, no increased work of breathing. GI: Abdomen is soft, nontender, nondistended, no abdominal masses GU: No CVA tenderness. Skin: No rashes, bruises or suspicious lesions. Lymph: No cervical or inguinal adenopathy. Neurologic: Grossly intact, no focal deficits, moving all 4 extremities. Psychiatric: Normal mood and affect.  Laboratory Data: Lab Results  Component Value Date   WBC 7.3 05/25/2016   HGB 13.9 08/01/2012   HCT 46.0 05/25/2016   MCV 90 05/25/2016   PLT 195 05/25/2016    Lab Results  Component Value Date   CREATININE 0.99 05/25/2016    No results found for: PSA  No results found for: TESTOSTERONE  No results found for: HGBA1C  Urinalysis    Component Value Date/Time   COLORURINE STRAW (A) 12/14/2010 0334   APPEARANCEUR CLEAR 12/14/2010 0334   LABSPEC 1.012 12/14/2010 0334   PHURINE 6.0 12/14/2010 0334   HGBUR NEGATIVE 12/14/2010 0334   BILIRUBINUR NEGATIVE 12/14/2010 0334   KETONESUR NEGATIVE 12/14/2010 0334   PROTEINUR NEGATIVE 12/14/2010 0334   UROBILINOGEN 0.2 12/14/2010 0334   NITRITE NEGATIVE 12/14/2010  0334   LEUKOCYTESUR  12/14/2010 0334    NEGATIVE MICROSCOPIC NOT DONE ON URINES WITH NEGATIVE PROTEIN, BLOOD, LEUKOCYTES, NITRITE, OR GLUCOSE <1000 mg/dL.    Pertinent Imaging: I have independently reviewed the patient's CT scan which demonstrates a left-sided 6 mm UPJ stone and small punctate calcifications in the lower pole.  Assessment & Plan:  Left 6 mm intermittently symptomatic UPJ stone.I discussed the treatment options with the patient including medical expulsion therapy, sho and ureteroscopy. We detailed the pros and cons of each modality including the risksand benefits. After going through the options, the patient has opted to proceed with shockwave lithotripsy. He has been off his own request for more than 5 days. He understands that there is a risk of perinephric hematoma poor fragmentation and the need for additional procedures.  Given the patient's overall excellent cardio vascular health I don't feel as if he needs cardiac clearance.  1. gross hematuria  - Urinalysis, Complete   No Follow-up on file.  HERRICK, BENJAMIN W, MD  [facility] [address], [address] ,   [place] [address] [phone]

## 2017-02-09 ENCOUNTER — Observation Stay (HOSPITAL_BASED_OUTPATIENT_CLINIC_OR_DEPARTMENT_OTHER)
Admission: RE | Admit: 2017-02-09 | Discharge: 2017-02-09 | Disposition: A | Discharge status: Home / Self Care | Payer: Medicare HMO | Source: Ambulatory Visit | Attending: Specialist | Admitting: Specialist

## 2017-02-09 ENCOUNTER — Ambulatory Visit: Payer: Medicare HMO

## 2017-02-09 ENCOUNTER — Observation Stay
Admission: RE | Admit: 2017-02-09 | Discharge: 2017-02-10 | Disposition: A | Discharge status: Home / Self Care | Payer: Medicare HMO | Source: Ambulatory Visit | Attending: Urology | Admitting: Urology

## 2017-02-09 ENCOUNTER — Encounter
Admission: RE | Disposition: A | Discharge status: Home / Self Care | Payer: Self-pay | Source: Ambulatory Visit | Attending: Urology

## 2017-02-09 DIAGNOSIS — I4892 Unspecified atrial flutter: Secondary | ICD-10-CM | POA: Diagnosis present

## 2017-02-09 DIAGNOSIS — Z8249 Family history of ischemic heart disease and other diseases of the circulatory system: Secondary | ICD-10-CM | POA: Insufficient documentation

## 2017-02-09 DIAGNOSIS — R310 Gross hematuria: Secondary | ICD-10-CM | POA: Insufficient documentation

## 2017-02-09 DIAGNOSIS — N132 Hydronephrosis with renal and ureteral calculous obstruction: Principal | ICD-10-CM | POA: Insufficient documentation

## 2017-02-09 DIAGNOSIS — I10 Essential (primary) hypertension: Secondary | ICD-10-CM | POA: Diagnosis not present

## 2017-02-09 DIAGNOSIS — Z79899 Other long term (current) drug therapy: Secondary | ICD-10-CM | POA: Diagnosis not present

## 2017-02-09 DIAGNOSIS — Q211 Atrial septal defect: Secondary | ICD-10-CM | POA: Diagnosis not present

## 2017-02-09 DIAGNOSIS — Z85828 Personal history of other malignant neoplasm of skin: Secondary | ICD-10-CM | POA: Diagnosis not present

## 2017-02-09 DIAGNOSIS — I471 Supraventricular tachycardia: Secondary | ICD-10-CM | POA: Diagnosis not present

## 2017-02-09 DIAGNOSIS — Z8619 Personal history of other infectious and parasitic diseases: Secondary | ICD-10-CM | POA: Diagnosis not present

## 2017-02-09 DIAGNOSIS — I480 Paroxysmal atrial fibrillation: Secondary | ICD-10-CM | POA: Diagnosis not present

## 2017-02-09 DIAGNOSIS — N200 Calculus of kidney: Secondary | ICD-10-CM

## 2017-02-09 DIAGNOSIS — I700 Atherosclerosis of aorta: Secondary | ICD-10-CM | POA: Insufficient documentation

## 2017-02-09 DIAGNOSIS — I493 Ventricular premature depolarization: Secondary | ICD-10-CM | POA: Diagnosis not present

## 2017-02-09 DIAGNOSIS — I483 Typical atrial flutter: Secondary | ICD-10-CM | POA: Diagnosis not present

## 2017-02-09 DIAGNOSIS — R55 Syncope and collapse: Secondary | ICD-10-CM | POA: Diagnosis not present

## 2017-02-09 DIAGNOSIS — H5509 Other forms of nystagmus: Secondary | ICD-10-CM | POA: Insufficient documentation

## 2017-02-09 DIAGNOSIS — Z9889 Other specified postprocedural states: Secondary | ICD-10-CM | POA: Diagnosis not present

## 2017-02-09 DIAGNOSIS — Z7901 Long term (current) use of anticoagulants: Secondary | ICD-10-CM | POA: Diagnosis not present

## 2017-02-09 DIAGNOSIS — K219 Gastro-esophageal reflux disease without esophagitis: Secondary | ICD-10-CM | POA: Insufficient documentation

## 2017-02-09 DIAGNOSIS — Z87442 Personal history of urinary calculi: Secondary | ICD-10-CM | POA: Diagnosis not present

## 2017-02-09 HISTORY — PX: EXTRACORPOREAL SHOCK WAVE LITHOTRIPSY: SHX1557

## 2017-02-09 LAB — GLUCOSE, CAPILLARY
GLUCOSE-CAPILLARY: 195 mg/dL — AB (ref 65–99)
Glucose-Capillary: 119 mg/dL — ABNORMAL HIGH (ref 65–99)
Glucose-Capillary: 104 mg/dL — ABNORMAL HIGH (ref 65–99)

## 2017-02-09 LAB — TROPONIN I: Troponin I: <0.03 ng/mL (ref <0.03)

## 2017-02-09 LAB — TSH: TSH: 0.58 u[IU]/mL (ref 0.350–4.500)

## 2017-02-09 LAB — BASIC METABOLIC PANEL
ANION GAP: 5 (ref 5–15)
BUN: 20 mg/dL (ref 6–20)
CHLORIDE: 102 mmol/L (ref 101–111)
CO2: 29 mmol/L (ref 22–32)
Calcium: 8.3 mg/dL — ABNORMAL LOW (ref 8.9–10.3)
Creatinine, Ser: 0.96 mg/dL (ref 0.61–1.24)
GFR calc Af Amer: >60 mL/min (ref >60)
GLUCOSE: 227 mg/dL — AB (ref 65–99)
POTASSIUM: 4.8 mmol/L (ref 3.5–5.1)
Sodium: 136 mmol/L (ref 135–145)

## 2017-02-09 LAB — MAGNESIUM: Magnesium: 2.4 mg/dL (ref 1.7–2.4)

## 2017-02-09 SURGERY — LITHOTRIPSY, ESWL
Anesthesia: Moderate Sedation | Laterality: Left

## 2017-02-09 MED ORDER — ADULT MULTIVITAMIN W/MINERALS CH
1.0000 | ORAL_TABLET | Freq: Every day | ORAL | Status: DC
  Administered 2017-02-09 – 2017-02-10 (×2): 1 via ORAL
  Filled 2017-02-09 (×2): qty 1

## 2017-02-09 MED ORDER — VITAMIN C 500 MG PO TABS
500.0000 mg | ORAL_TABLET | Freq: Every day | ORAL | Status: DC
Start: 2017-02-09 — End: 2017-02-10
  Administered 2017-02-09 – 2017-02-10 (×2): 500 mg via ORAL
  Filled 2017-02-09 (×2): qty 1

## 2017-02-09 MED ORDER — INSULIN ASPART 100 UNIT/ML ~~LOC~~ SOLN: 0.0000 [IU] | Freq: Every day | SUBCUTANEOUS | Status: DC

## 2017-02-09 MED ORDER — OXYCODONE HCL 5 MG PO TABS: 5.0000 mg | ORAL_TABLET | ORAL | Status: DC | PRN

## 2017-02-09 MED ORDER — DIAZEPAM 5 MG PO TABS
10.0000 mg | ORAL_TABLET | Freq: Once | ORAL | Status: AC
  Administered 2017-02-09: 10 mg via ORAL

## 2017-02-09 MED ORDER — HYDROCODONE-ACETAMINOPHEN 5-325 MG PO TABS: 1.0000 | ORAL_TABLET | Freq: Four times a day (QID) | ORAL | 0 refills | Status: DC | PRN

## 2017-02-09 MED ORDER — DIPHENHYDRAMINE HCL 25 MG PO CAPS
25.0000 mg | ORAL_CAPSULE | Freq: Once | ORAL | Status: AC
  Administered 2017-02-09: 25 mg via ORAL

## 2017-02-09 MED ORDER — DOCUSATE SODIUM 100 MG PO CAPS
100.0000 mg | ORAL_CAPSULE | Freq: Two times a day (BID) | ORAL | Status: DC
  Administered 2017-02-09: 100 mg via ORAL
  Filled 2017-02-09 (×2): qty 1

## 2017-02-09 MED ORDER — CIPROFLOXACIN HCL 500 MG PO TABS
ORAL_TABLET | ORAL | Status: AC
  Administered 2017-02-09: 500 mg via ORAL
  Filled 2017-02-09: qty 1

## 2017-02-09 MED ORDER — CIPROFLOXACIN HCL 500 MG PO TABS
500.0000 mg | ORAL_TABLET | Freq: Once | ORAL | Status: AC
  Administered 2017-02-09: 500 mg via ORAL

## 2017-02-09 MED ORDER — INSULIN ASPART 100 UNIT/ML ~~LOC~~ SOLN: 0.0000 [IU] | Freq: Three times a day (TID) | SUBCUTANEOUS | Status: DC

## 2017-02-09 MED ORDER — SODIUM CHLORIDE 0.9 % IV SOLN
INTRAVENOUS | Status: DC
  Administered 2017-02-09 – 2017-02-10 (×3): via INTRAVENOUS

## 2017-02-09 MED ORDER — ACETAMINOPHEN 325 MG PO TABS
650.0000 mg | ORAL_TABLET | ORAL | Status: DC | PRN
Start: 2017-02-09 — End: 2017-02-10

## 2017-02-09 MED ORDER — MORPHINE SULFATE (PF) 2 MG/ML IV SOLN: 2.0000 mg | INTRAVENOUS | Status: DC | PRN

## 2017-02-09 MED ORDER — DIPHENHYDRAMINE HCL 25 MG PO CAPS
ORAL_CAPSULE | ORAL | Status: AC
  Filled 2017-02-09: qty 1

## 2017-02-09 MED ORDER — DEXTROSE-NACL 5-0.45 % IV SOLN
INTRAVENOUS | Status: DC
  Administered 2017-02-09: 08:00:00 via INTRAVENOUS

## 2017-02-09 MED ORDER — DIAZEPAM 5 MG PO TABS
ORAL_TABLET | ORAL | Status: AC
  Administered 2017-02-09: 10 mg via ORAL
  Filled 2017-02-09: qty 2

## 2017-02-09 MED ORDER — TAMSULOSIN HCL 0.4 MG PO CAPS
0.4000 mg | ORAL_CAPSULE | Freq: Every day | ORAL | Status: DC
  Administered 2017-02-09: 0.4 mg via ORAL
  Filled 2017-02-09: qty 1

## 2017-02-09 MED ORDER — ONDANSETRON HCL 4 MG/2ML IJ SOLN: 4.0000 mg | INTRAMUSCULAR | Status: DC | PRN

## 2017-02-09 MED ORDER — APIXABAN 5 MG PO TABS
5.0000 mg | ORAL_TABLET | Freq: Two times a day (BID) | ORAL | Status: DC
  Administered 2017-02-09 – 2017-02-10 (×2): 5 mg via ORAL
  Filled 2017-02-09 (×2): qty 1

## 2017-02-09 NOTE — OR Nursing (Addendum)
Pt arrives to post op via wheelchair, pt became pale, diaphoretic, pulse in 40's, pt became unresponsive for 2-3 seconds, pt responded to sternal rub. At 1023 Oxygen per nasal cannula started at 5 liters, Pulse 38, O2 sat. 96%, BP 153/133, pt c/o feeling nauseated., no vomiting. 1025 pt transferred to stretcher cardiac monitor applied, pt in sinus bradycardia.

## 2017-02-09 NOTE — H&P (View-Only) (Signed)
02/08/2017 2:36 PM   Vaughan Basta 10-30-1945 [id number]  Referring provider: Delton Prairie, MD [address] [facility] [place], [place] [address]  No chief complaint on file.   HPI: 71 year old male presented to the urology clinic today for further management and evaluation of a 6 mm left proximal ureteral stone. The patient initially presented to his primary care provider with gross hematuria.  The next day he developed renal colic symptoms ear and a CT scan was then ordered in the urgent care clinic and it was notable for an obstructing left  UPJ stone. The patienthas small punctate stones in the left lower pole as well. He has had intermittent colicky type symptoms without associated nausea or vomiting. He has not had any fevers or chills. The patient takes Eliquis for his atrial fibrillation which was stopped on Friday and since then his hematuria has cleared.  The patient has a history of nephrolithiasis, and was treated with open ureterolithotomy in the  Early 19 eighties. He has not had any stones since that time.  The patient has a past medical history significant for atrial fibrillation and has had 2cardiac ablations. He has bein sinus rhythm for over one year, but remained on anticoagulation for preventative measures.  The patient is otherwise very healthy, run several miles a day, plays basketball, and golf.   PMH: Past Medical History:  Diagnosis Date  . Atrial flutter/fib 10/27/2011   flecainide::CHADSVASc--1>> no anticoag 2/14  . Atrial tachycardia (HCC)   . 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 History of shingles   . Nephrolithiasis   . PVC (premature ventricular contraction)    asymptomatic  . Syncope    Neurally mediated-presumed::also ?assoc with AFib  . Vertical nystagmus    no recent issues  . Wears dentures    full upper, partial lower    Surgical History: Past Surgical History:  Procedure Laterality Date  . CARDIAC  ELECTROPHYSIOLOGY STUDY AND ABLATION  2015, 2016  . COLONOSCOPY WITH PROPOFOL N/A 10/03/2016   Procedure: COLONOSCOPY WITH PROPOFOL;  Surgeon: Midge Minium, MD;  Location: [facility];  Service: Endoscopy;  Laterality: N/A;  . HERNIA REPAIR     bilateral  . NEPHROSTOMY     open, left kidney stone  . SQUAMOUS CELL CARCINOMA EXCISION     Head    Home Medications:  Allergies as of 02/08/2017   No Known Allergies     Medication List       Accurate as of 02/08/17  2:36 PM. Always use your most recent med list.          ELIQUIS 5 MG Tabs tablet Generic drug:  apixaban TAKE 1 TABLET(5 MG) BY MOUTH TWICE DAILY   metoprolol succinate 12.5 mg Tb24 24 hr tablet Commonly known as:  TOPROL-XL Take 0.5 tablets (12.5 mg total) by mouth every 12 (twelve) hours.   multivitamin tablet Take 1 tablet by mouth daily.   tamsulosin 0.4 MG Caps capsule Commonly known as:  FLOMAX Take 1 capsule by mouth daily.   vitamin C 500 MG tablet Commonly known as:  ASCORBIC ACID Take 500 mg by mouth daily.       Allergies: No Known Allergies  Family History: Family History  Problem Relation Age of Onset  . Heart failure Mother     Social History:  reports that he has never smoked. He has never used smokeless tobacco. He reports that he does not drink alcohol or use  drugs.  ROS: UROLOGY Frequent Urination?: No Hard to postpone urination?: No Burning/pain with urination?: No Get up at night to urinate?: Yes Leakage of urine?: No Urine stream starts and stops?: No Trouble starting stream?: No Do you have to strain to urinate?: No Blood in urine?: Yes Urinary tract infection?: No Sexually transmitted disease?: No Injury to kidneys or bladder?: No Painful intercourse?: No Weak stream?: No Erection problems?: Yes Penile pain?: No  Gastrointestinal Nausea?: No Vomiting?: No Indigestion/heartburn?: No Diarrhea?: No Constipation?: No  Constitutional Fever: No Night  sweats?: No Weight loss?: No Fatigue?: No  Skin Skin rash/lesions?: No Itching?: No  Eyes Blurred vision?: No Double vision?: No  Ears/Nose/Throat Sore throat?: No Sinus problems?: No  Hematologic/Lymphatic Swollen glands?: No Easy bruising?: No  Cardiovascular Leg swelling?: No Chest pain?: No  Respiratory Cough?: No Shortness of breath?: No  Endocrine Excessive thirst?: No  Musculoskeletal Back pain?: No Joint pain?: No  Neurological Headaches?: No Dizziness?: No  Psychologic Depression?: No Anxiety?: No  Physical Exam: BP 118/71   Pulse 73   Ht 6' (1.829 m)   Wt 93 kg (205 lb 1.6 oz)   BMI 27.82 kg/m   Constitutional:  Alert and oriented, No acute distress. HEENT: [place] AT, moist mucus membranes.  Trachea midline, no masses. Cardiovascular: No clubbing, cyanosis, or edema. Respiratory: Normal respiratory effort, no increased work of breathing. GI: Abdomen is soft, nontender, nondistended, no abdominal masses GU: No CVA tenderness. Skin: No rashes, bruises or suspicious lesions. Lymph: No cervical or inguinal adenopathy. Neurologic: Grossly intact, no focal deficits, moving all 4 extremities. Psychiatric: Normal mood and affect.  Laboratory Data: Lab Results  Component Value Date   WBC 7.3 05/25/2016   HGB 13.9 08/01/2012   HCT 46.0 05/25/2016   MCV 90 05/25/2016   PLT 195 05/25/2016    Lab Results  Component Value Date   CREATININE 0.99 05/25/2016    No results found for: PSA  No results found for: TESTOSTERONE  No results found for: HGBA1C  Urinalysis    Component Value Date/Time   COLORURINE STRAW (A) 12/14/2010 0334   APPEARANCEUR CLEAR 12/14/2010 0334   LABSPEC 1.012 12/14/2010 0334   PHURINE 6.0 12/14/2010 0334   HGBUR NEGATIVE 12/14/2010 0334   BILIRUBINUR NEGATIVE 12/14/2010 0334   KETONESUR NEGATIVE 12/14/2010 0334   PROTEINUR NEGATIVE 12/14/2010 0334   UROBILINOGEN 0.2 12/14/2010 0334   NITRITE NEGATIVE 12/14/2010  0334   LEUKOCYTESUR  12/14/2010 0334    NEGATIVE MICROSCOPIC NOT DONE ON URINES WITH NEGATIVE PROTEIN, BLOOD, LEUKOCYTES, NITRITE, OR GLUCOSE <1000 mg/dL.    Pertinent Imaging: I have independently reviewed the patient's CT scan which demonstrates a left-sided 6 mm UPJ stone and small punctate calcifications in the lower pole.  Assessment & Plan:  Left 6 mm intermittently symptomatic UPJ stone.I discussed the treatment options with the patient including medical expulsion therapy, sho and ureteroscopy. We detailed the pros and cons of each modality including the risksand benefits. After going through the options, the patient has opted to proceed with shockwave lithotripsy. He has been off his own request for more than 5 days. He understands that there is a risk of perinephric hematoma poor fragmentation and the need for additional procedures.  Given the patient's overall excellent cardio vascular health I don't feel as if he needs cardiac clearance.  1. gross hematuria  - Urinalysis, Complete   No Follow-up on file.  Crist Fat, MD  [facility] [address], [address] [place],  [place] [address] [phone]

## 2017-02-09 NOTE — Plan of Care (Signed)
Problem: Nutrition: Goal: Adequate nutrition will be maintained Outcome: Progressing Educated patient on ordered diet

## 2017-02-09 NOTE — Progress Notes (Signed)
Inpatient Diabetes Program Recommendations  AACE/ADA: New Consensus Statement on Inpatient Glycemic Control (2015)  Target Ranges:  Prepandial:   less than 140 mg/dL      Peak postprandial:   less than 180 mg/dL (1-2 hours)      Critically ill patients:  140 - 180 mg/dL  Results for REED, DADY (MRN [id number]) as of 02/09/2017 13:26  Ref. Range 02/09/2017 11:00  Glucose Latest Ref Range: 65 - 99 mg/dL 409 (H)    Review of Glycemic Control  Diabetes history: No Outpatient Diabetes medications: NA Current orders for Inpatient glycemic control: None  Inpatient Diabetes Program Recommendations: Correction (SSI): Noted lab glucose of 227 mg/dl at 81:19 am today. May want to consider ordering CBGs with Novolog sensitive correction scale if needed while inpatient. HgbA1C: Please consider ordering an A1C to evalutate glycemic control over the past 2-3 months.  Thanks, Orlando Penner, RN, MSN, CDE Diabetes Coordinator Inpatient Diabetes Program [phone] (Team Pager from 8am to 5pm)

## 2017-02-09 NOTE — OR Nursing (Addendum)
1026 Dr Apolinar Junes called and given report on pt, instructed to call a rapid response and stat EKG. 1027 rapid response called, Cardiopulmonary called for stat EKG. 1030 Rapid response team here. 1035 Dr. Apolinar Junes here. 1036 EKG done, sinus bradycardia.  2nd bag of IV fluid hung, NS.  Left flank 6 cm pink area with 1 cm dark red area.  Pt denies pain.

## 2017-02-09 NOTE — OR Nursing (Signed)
1100 FS 195. Lab into draw blood. 1103 Hospitalist into see pt.

## 2017-02-09 NOTE — Progress Notes (Signed)
Pt. Noted to have Random BS > 200.  No hx of DM. Will place on SSI and check Hemoglobin A1c.

## 2017-02-09 NOTE — Progress Notes (Signed)
[facility] responded to a page for RR of a Pt in Same Day Surgery. Nurse stated that Pt blood pressure had dropped significantly. Mt team was working to stabilize Pt.     02/09/17 1200  Clinical Encounter Type  Visited With Patient;Health care provider  Visit Type Initial;Social support;Code;Trauma  Referral From Nurse  Consult/Referral To Chaplain  Spiritual Encounters  Spiritual Needs Prayer;Emotional;Other (Comment)

## 2017-02-09 NOTE — Consult Note (Signed)
Cardiology Consultation Note  Patient ID: Billy Carr, MRN: [id number], DOB/AGE: April 11, 1946 71 y.o. Admit date: 02/09/2017   Date of Consult: 02/09/2017 Primary Physician: Marisue Ivan, MD Primary Cardiologist: Dr. Kirke Corin, MD Requesting Physician: Dr. Cherlynn Kaiser, MD  Chief Complaint: Scheduled shockwave lithotripsy Reason for Consult: Syncope  HPI: Billy Carr is a 71 y.o. male who is being seen today for the evaluation of syncope at the request of Dr. Cherlynn Kaiser, MD. Patient has a h/o PAF s/p RF ablation with PVI on 10/30/2013 with repeat ablation 05/22/2014, typical atrial flutter, SVT, atrial tachycardia with variable block, orthostatic intolerance, syncope, and renal stones who presented to [facility] for shockwave lithotripsy and had a syncopal episode in recovery.    In 2012, an event recorder was negative; a stress test demonstrated normal left ventricular function with 90% achieved maximal heart rate and no ischemia. He was seen Feb 2013 with tachy palpitations and was found to be in atrial flutter which terminated spontaneously couple of days afterwards. At that time there was a discussion regarding possible ablation; but he was having nocturnal tachy palpitations and an event recorder was ordered>> atrial fib with spells occurring in clusters the longest of which was about an hour and a half with an average heart rate of about 110 at peak heart rates in the 160s. He presented in 04/2012 to [facility] after he had 3 syncopal episodes all of them were sudden. He was noted to be in atrial flutter and mildly tachycardic by EMS. He was later noted to have atrial fibrillation on telemetry. It was felt that the syncope might have been related to post termination pulses or 1-1 conduction with atrial flutter. CT head was unremarkable. Carotid duplex showed no significant disease. He converted to sinus rhythm with metoprolol and was started on flecainide 50 mg twice daily as well as Xarelto for  anticoagulation. He was previously on flecainide in 2014, followed by Tikosyn in 12/2013. Upon discontinuation he noted increaed Afib burden. He was last seen in clinic on 08/09/2013 and was doing well at that time. He has since been seeing [facility]. Last echo from [facility] in 05/2014 showed normal EF, mild MR, ASD, aortic sclerosis. At his [facility] Caridology follow up on 07/17/2015 he noted no symptoms concerning for Afib since 12/2013. Outpatient monitoring 3-01/2015 showed no Afib, with underlying predominat rhythm of sinus, runs of SVT with a max HR of 245 bpm and avg HR of 119 bpm. Atrial tachycardia with variable block. Seen in 05/2016 for increase in palpiations with some atypical chest pain. Underwent treadmill nuclear stress test which showed no evidence of ischami with normal EF. Echo at that time showed normal LV systolic function, mild MR, and mildly dilated aortic root at 39 mm. Holter monitor showed normal sinus rhythm with mild PACs and PVCs with mild sinus tachycardia without evidence of atrial fibrillation. He was most recently seen by Dr. Kirke Corin in 06/2016 and feeling well at that time. He felt like his above symptoms in August were related to anxiety.   Patient was admitted to [facility] on 4/26 by Urology for shockwave lithotripsy of a left-sided 6 mm UPJ stone and small punctate calcifications in the lower pole. Post-operatively, he stood from the wheelchair to move to the bed, became pale and diaphoretic with a heart rate in the 40s bpm. He became unresponsive for approximately 2-3 seconds per notes and responded to sternal rub. Rapid response was called. Initial vitals indicated a BP of 153/133, HR 38 bpm, O2  sat 96% on 5 L. Stat EKG at 10:36 AM showed sinus bradycardia, 52 bpm, left axis deviation, nonspecific anterolateral st/t changes. Patient does not recall these events and they are taken from prior notes. Vital sign trended to a low BP of 89/59 with a low bradycardic rate of 48 bpm. Of note, patient dd  take some magnesium citrate the night prior along with an enema to "clean out his bowels." Internal medicine was consulted who felt like this was a vasovagal episode from volume depletion. His metoprolol was held 2/2 bradycardia. IM ordered an echo. Initial troponin negative x 1. Bmet with glucose of 227, SCr 0.96, K+ 4.8. Cardiology was also asked to evaluate the patient.   In talking with the patient and his son this afternoon he does not recall the above and feels back to his baseline. During his prior episodes of Afib/flutter with was symptomatic with palpitations. He never felt any palpitations with today's events. Neer with chest pain or SOB. Not currently connected to telemetry.   Past Medical History:  Diagnosis Date  . Atrial flutter/fib 10/27/2011   flecainide::CHADSVASc--1>> no anticoag 2/14  . Atrial tachycardia (HCC)   . 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 History of shingles   . Nephrolithiasis   . PVC (premature ventricular contraction)    asymptomatic  . Syncope    Neurally mediated-presumed::also ?assoc with AFib  . Vertical nystagmus    no recent issues  . Wears dentures    full upper, partial lower      Most Recent Cardiac Studies: As above   Surgical History:  Past Surgical History:  Procedure Laterality Date  . CARDIAC ELECTROPHYSIOLOGY STUDY AND ABLATION  2015, 2016  . COLONOSCOPY WITH PROPOFOL N/A 10/03/2016   Procedure: COLONOSCOPY WITH PROPOFOL;  Surgeon: Midge Minium, MD;  Location: [facility];  Service: Endoscopy;  Laterality: N/A;  . HERNIA REPAIR     bilateral  . NEPHROSTOMY     open, left kidney stone  . SQUAMOUS CELL CARCINOMA EXCISION     Head     Home Meds: Prior to Admission medications   Medication Sig Start Date End Date Taking? Authorizing Provider  metoprolol succinate (TOPROL-XL) 12.5 mg TB24 24 hr tablet Take 0.5 tablets (12.5 mg total) by mouth every 12 (twelve) hours. 10/16/13  Yes Iran Ouch, MD  Multiple  Vitamin (MULTIVITAMIN) tablet Take 1 tablet by mouth daily.     Yes Historical Provider, MD  tamsulosin (FLOMAX) 0.4 MG CAPS capsule Take 1 capsule by mouth daily. 02/06/17  Yes Historical Provider, MD  vitamin C (ASCORBIC ACID) 500 MG tablet Take 500 mg by mouth daily.   Yes Historical Provider, MD  apixaban (ELIQUIS) 5 MG TABS tablet TAKE 1 TABLET(5 MG) BY MOUTH TWICE DAILY 03/02/16   Historical Provider, MD  HYDROcodone-acetaminophen (NORCO/VICODIN) 5-325 MG tablet Take 1-2 tablets by mouth every 6 (six) hours as needed for moderate pain. 02/09/17   Vanna Scotland, MD    Inpatient Medications:  . diphenhydrAMINE       . dextrose 5 % and 0.45% NaCl 125 mL/hr at 02/09/17 6045    Allergies: No Known Allergies  Social History   Social History  . Marital status: Married    Spouse name: N/A  . Number of children: N/A  . Years of education: N/A   Occupational History  . Minister    Social History Main Topics  . Smoking status: Never Smoker  . Smokeless tobacco: Never Used  .  Alcohol use No  . Drug use: No  . Sexual activity: Not on file   Other Topics Concern  . Not on file   Social History Narrative  . No narrative on file     Family History  Problem Relation Age of Onset  . Heart failure Mother      Review of Systems: Review of Systems  Constitutional: Positive for diaphoresis. Negative for chills, fever, malaise/fatigue and weight loss.  HENT: Negative for congestion.   Eyes: Negative for discharge and redness.  Respiratory: Negative for cough, hemoptysis, sputum production, shortness of breath and wheezing.   Cardiovascular: Negative for chest pain, palpitations, orthopnea, claudication, leg swelling and PND.  Gastrointestinal: Positive for diarrhea. Negative for abdominal pain, blood in stool, heartburn, melena, nausea and vomiting.  Genitourinary: Negative for hematuria.  Musculoskeletal: Negative for falls and myalgias.  Skin: Negative for rash.  Neurological:  Positive for dizziness and loss of consciousness. Negative for tingling, tremors, sensory change, speech change, focal weakness, seizures, weakness and headaches.  Endo/Heme/Allergies: Does not bruise/bleed easily.  Psychiatric/Behavioral: Negative for substance abuse. The patient is not nervous/anxious.   All other systems reviewed and are negative.   Labs:  Recent Labs  02/09/17 1100  TROPONINI <0.03   Lab Results  Component Value Date   WBC 7.3 05/25/2016   HGB 13.9 08/01/2012   HCT 46.0 05/25/2016   MCV 90 05/25/2016   PLT 195 05/25/2016     Recent Labs Lab 02/09/17 1100  NA 136  K 4.8  CL 102  CO2 29  BUN 20  CREATININE 0.96  CALCIUM 8.3*  GLUCOSE 227*   Lab Results  Component Value Date   CHOL 130 08/01/2012   HDL 34 (L) 08/01/2012   LDLCALC 81 08/01/2012   TRIG 77 08/01/2012   No results found for: DDIMER  Radiology/Studies:  Dg Abd 1 View  Result Date: 02/09/2017 CLINICAL DATA:  Pre lithotripsy.  Left kidney stone. EXAM: ABDOMEN - 1 VIEW COMPARISON:  CT 02/03/2017 FINDINGS: 6 mm calcification projects over the lower pole of the left kidney, likely the previously seen proximal left ureteral stone. No other visible suspicious stones. Calcified phleboliths in the pelvis. Nonobstructive bowel gas pattern. No organomegaly. Levoscoliosis in the lumbar spine. No acute bony abnormality. IMPRESSION: 6 mm proximal left ureteral stone. Electronically Signed   By: Charlett Nose M.D.   On: 02/09/2017 08:13   Ct Abdomen Pelvis W Contrast  Result Date: 02/03/2017 CLINICAL DATA:  Acute left lower quadrant abdominal pain. EXAM: CT ABDOMEN AND PELVIS WITH CONTRAST TECHNIQUE: Multidetector CT imaging of the abdomen and pelvis was performed using the standard protocol following bolus administration of intravenous contrast. CONTRAST:  125 mL of Isovue 370 intravenously. COMPARISON:  CT scan of July 19, 2012. FINDINGS: Lower chest: No acute abnormality. Hepatobiliary: No focal  liver abnormality is seen. No gallstones, gallbladder wall thickening, or biliary dilatation. Pancreas: Unremarkable. No pancreatic ductal dilatation or surrounding inflammatory changes. Spleen: Normal in size without focal abnormality. Adrenals/Urinary Tract: Adrenal glands appear normal. Right kidney and ureter appear normal. Urinary bladder is unremarkable. Mild left hydronephrosis is noted secondary to 6 mm calculus at the left ureteropelvic junction. Stomach/Bowel: Stool is noted throughout the colon. There is no evidence of bowel obstruction. The appendix is not visualized. Vascular/Lymphatic: Aortic atherosclerosis. No enlarged abdominal or pelvic lymph nodes. Reproductive: Prostate is unremarkable. Other: No abdominal wall hernia or abnormality. No abdominopelvic ascites. Musculoskeletal: No acute or significant osseous findings. IMPRESSION: Mild left hydronephrosis secondary  to 6 mm calculus at the left ureterovesical junction. Aortic atherosclerosis. Electronically Signed   By: Lupita Raider, M.D.   On: 02/03/2017 14:19    EKG: Interpreted by me showed: sinus bradycardia, 52 bpm, left axis deviation, nonspecific anterolateral st/t changes Telemetry: Interpreted by me showed: not on telemetry   Weights: Filed Weights   02/09/17 0745  Weight: 205 lb (93 kg)     Physical Exam: Blood pressure 93/60, pulse (!) 49, temperature 97.1 F (36.2 C), temperature source Tympanic, resp. rate 15, weight 205 lb (93 kg), SpO2 100 %. Body mass index is 27.8 kg/m. General: Well developed, well nourished, in no acute distress. Head: Normocephalic, atraumatic, sclera non-icteric, no xanthomas, nares are without discharge.  Neck: Negative for carotid bruits. JVD not elevated. Lungs: Clear bilaterally to auscultation without wheezes, rales, or rhonchi. Breathing is unlabored. Heart: Bradycardic with S1 S2. No murmurs, rubs, or gallops appreciated. Abdomen: Soft, non-tender, non-distended with normoactive  bowel sounds. No hepatomegaly. No rebound/guarding. No obvious abdominal masses. Msk:  Strength and tone appear normal for age. Extremities: No clubbing or cyanosis. No edema. Distal pedal pulses are 2+ and equal bilaterally. Neuro: Alert and oriented X 3. No facial asymmetry. No focal deficit. Moves all extremities spontaneously. Psych:  Responds to questions appropriately with a normal affect.    Assessment and Plan:  Principal Problem:   Kidney stone on left side Active Problems:   Syncope   Atrial flutter/fibrillation    1. Syncope: -Likely vasovagal/volume depletion in the setting of magnesium citrate the prior night and an enema this morning -Recommend IV hydration  -Cannot definitively rule out post termination pause if the patient was in fac tin Afib/flutter prior to this event though he was asymptomatic and has previously been able to determine when he is in Afib/flutter 2/2 palpitations -Continue to cycle troponin to rule out -Echo per IM -Monitor on telemetry  -Hold beta blocker 2/2 bradycardia -Check magnesium and tsh  2. Afib/flutter: -Status post ablation x 2 as above -Currently in sinus rhythm with bradycardic rate -Hold beta blocker 2/2 bradycardia -Eliquis -CHADS2VASc at least 1 (age x 1) -Check mag and tsh as above  3. Renal stone: -S/p lithotripsy -Per urology  4. Diarrhea: -Likely 2/2 magnesium citrate and enema -Check lytes    Signed, Eula Listen, PA-C [facility] Pager: [phone] 02/09/2017, 12:43 PM

## 2017-02-09 NOTE — Interval H&P Note (Signed)
History and Physical Interval Note:  02/09/2017 8:58 AM  Billy Carr  has presented today for surgery, with the diagnosis of Kidney stone  The various methods of treatment have been discussed with the patient and family. After consideration of risks, benefits and other options for treatment, the patient has consented to  Procedure(s): EXTRACORPOREAL SHOCK WAVE LITHOTRIPSY (ESWL) (Left) as a surgical intervention .  The patient's history has been reviewed, patient examined, no change in status, stable for surgery.  I have reviewed the patient's chart and labs.  Questions were answered to the patient's satisfaction.    RRR CTAB   Vanna Scotland

## 2017-02-09 NOTE — Discharge Instructions (Signed)
See [facility] discharge instructions in chart.  

## 2017-02-09 NOTE — Consult Note (Signed)
[employer] [facility] [place] [facility]   PATIENT NAME: Billy Carr    MR#:  [id number]  DATE OF BIRTH:  1945/12/23  DATE OF CONSULT:  02/09/2017  PRIMARY CARE PHYSICIAN: Marisue Ivan, MD   REQUESTING/REFERRING PHYSICIAN: Dr. Vanna Scotland  CHIEF COMPLAINT:   Syncope.  HISTORY OF PRESENT ILLNESS:  Billy Carr  is a 71 y.o. male with a known history of Atrial fibrillation/flutter, history of atrial tachycardia, nephrolithiasis, essential hypertension who presented to the hospital due to a elective lithotripsy for nephrolithiasis and postoperatively was noted to have a syncopal episode. Patient himself cannot recall the events therefore most of the history obtained from the nurse at bedside. As per the nurse patient after lithotripsy brought up to recovery in a wheelchair and when he stood up he became diaphoretic, he turned pale, his heart rates were noted to be in the 30s and he was also hypotensive. He came back around shortly within a few seconds. Patient cannot recall any of these events. Patient does say that he hydrate himself well prior to his procedure today and has not drank or eaten anything since late evening yesterday. Patient also does say that he took mag citrate and enema last night to clean out his bowels. Presently he is alert and oriented and asymptomatic. Hospitalist services were called for consultation.  PAST MEDICAL HISTORY:   Past Medical History:  Diagnosis Date  . Atrial flutter/fib 10/27/2011   flecainide::CHADSVASc--1>> no anticoag 2/14  . Atrial tachycardia (HCC)   . 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 History of shingles   . Nephrolithiasis   . PVC (premature ventricular contraction)    asymptomatic  . Syncope    Neurally mediated-presumed::also ?assoc with AFib  . Vertical nystagmus    no recent issues  . Wears dentures    full upper, partial lower    PAST SURGICAL HISTOIRY:   Past Surgical History:  Procedure  Laterality Date  . CARDIAC ELECTROPHYSIOLOGY STUDY AND ABLATION  2015, 2016  . COLONOSCOPY WITH PROPOFOL N/A 10/03/2016   Procedure: COLONOSCOPY WITH PROPOFOL;  Surgeon: Midge Minium, MD;  Location: [facility];  Service: Endoscopy;  Laterality: N/A;  . HERNIA REPAIR     bilateral  . NEPHROSTOMY     open, left kidney stone  . SQUAMOUS CELL CARCINOMA EXCISION     Head    SOCIAL HISTORY:   Social History  Substance Use Topics  . Smoking status: Never Smoker  . Smokeless tobacco: Never Used  . Alcohol use No    FAMILY HISTORY:   Family History  Problem Relation Age of Onset  . Heart failure Mother     DRUG ALLERGIES:  No Known Allergies  REVIEW OF SYSTEMS:   Review of Systems  Constitutional: Negative for fever and weight loss.  HENT: Negative for congestion, nosebleeds and tinnitus.   Eyes: Negative for blurred vision, double vision and redness.  Respiratory: Negative for cough, hemoptysis and shortness of breath.   Cardiovascular: Negative for chest pain, orthopnea, leg swelling and PND.  Gastrointestinal: Positive for nausea. Negative for abdominal pain, diarrhea, melena and vomiting.  Genitourinary: Negative for dysuria, hematuria and urgency.  Musculoskeletal: Negative for falls and joint pain.  Neurological: Negative for dizziness, tingling, sensory change, focal weakness, seizures, weakness and headaches.  Endo/Heme/Allergies: Negative for polydipsia. Does not bruise/bleed easily.  Psychiatric/Behavioral: Negative for depression and memory loss. The patient is not nervous/anxious.      MEDICATIONS AT HOME:  Prior to Admission medications   Medication Sig Start Date End Date Taking? Authorizing Provider  metoprolol succinate (TOPROL-XL) 12.5 mg TB24 24 hr tablet Take 0.5 tablets (12.5 mg total) by mouth every 12 (twelve) hours. 10/16/13  Yes Iran Ouch, MD  Multiple Vitamin (MULTIVITAMIN) tablet Take 1 tablet by mouth daily.     Yes Historical  Provider, MD  tamsulosin (FLOMAX) 0.4 MG CAPS capsule Take 1 capsule by mouth daily. 02/06/17  Yes Historical Provider, MD  vitamin C (ASCORBIC ACID) 500 MG tablet Take 500 mg by mouth daily.   Yes Historical Provider, MD  apixaban (ELIQUIS) 5 MG TABS tablet TAKE 1 TABLET(5 MG) BY MOUTH TWICE DAILY 03/02/16   Historical Provider, MD  HYDROcodone-acetaminophen (NORCO/VICODIN) 5-325 MG tablet Take 1-2 tablets by mouth every 6 (six) hours as needed for moderate pain. 02/09/17   Vanna Scotland, MD      VITAL SIGNS:  Blood pressure 97/62, pulse (!) 48, temperature 97.1 F (36.2 C), temperature source Tympanic, resp. rate 17, weight 93 kg (205 lb), SpO2 95 %.  PHYSICAL EXAMINATION:  GENERAL:  71 y.o.-year-old patient lying in the bed with no acute distress.  EYES: Pupils equal, round, reactive to light and accommodation. No scleral icterus. Extraocular muscles intact.  HEENT: Head atraumatic, normocephalic. Oropharynx and nasopharynx clear.  NECK:  Supple, no jugular venous distention. No thyroid enlargement, no tenderness.  LUNGS: Normal breath sounds bilaterally, no wheezing, rales, rhonchi . No use of accessory muscles of respiration.  CARDIOVASCULAR: S1, S2, Bradycardia. No murmurs, rubs, gallops, clicks.  ABDOMEN: Soft, nontender, nondistended. Bowel sounds present. No organomegaly or mass.  EXTREMITIES: No pedal edema, cyanosis, or clubbing.  NEUROLOGIC: Cranial nerves II through XII are intact. No focal motor or sensory deficits appreciated bilaterally  PSYCHIATRIC: The patient is alert and oriented x 3. SKIN: No obvious rash, lesion, or ulcer.   LABORATORY PANEL:   CBC No results for input(s): WBC, HGB, HCT, PLT in the last 168 hours. ------------------------------------------------------------------------------------------------------------------  Chemistries   Recent Labs Lab 02/09/17 1100  NA 136  K 4.8  CL 102  CO2 29  GLUCOSE 227*  BUN 20  CREATININE 0.96  CALCIUM 8.3*    ------------------------------------------------------------------------------------------------------------------  Cardiac Enzymes  Recent Labs Lab 02/09/17 1100  TROPONINI <0.03   ------------------------------------------------------------------------------------------------------------------  RADIOLOGY:  Dg Abd 1 View  Result Date: 02/09/2017 CLINICAL DATA:  Pre lithotripsy.  Left kidney stone. EXAM: ABDOMEN - 1 VIEW COMPARISON:  CT 02/03/2017 FINDINGS: 6 mm calcification projects over the lower pole of the left kidney, likely the previously seen proximal left ureteral stone. No other visible suspicious stones. Calcified phleboliths in the pelvis. Nonobstructive bowel gas pattern. No organomegaly. Levoscoliosis in the lumbar spine. No acute bony abnormality. IMPRESSION: 6 mm proximal left ureteral stone. Electronically Signed   By: Charlett Nose M.D.   On: 02/09/2017 08:13     IMPRESSION AND PLAN:   71 year old male with past medical history of atrial fibrillation/flutter, PVCs, nephrolithiasis, GERD, previous history of syncope who presents to the hospital for elective Lithotripsy for nephrolithiasis and postoperatively noted to have a syncopal episode.  1. Syncope-I suspect this was vasovagal syncope from volume depletion. Patient apparently had taking mag citrate and enema to clear his bowels overnight which is apparently by mistake. -His EKG shows sinus bradycardia but no evidence of acute ST or T-wave changes. -We will observe him on telemetry, cycle his cardiac markers, his first set is negative. I will consult cardiology. -I will get a two-dimensional echocardiogram. -  Hold his metoprolol for now.  2. History of atrial fibrillation/flutter-patient currently is in sinus rhythm. These rate controlled although bradycardic. Hold metoprolol, cont. eliquis.  - await further Cardiology input.   3. Nephrolithiasis status post lithotripsy-continue further care as per urology. -  cont. Flomax.   Thanks for the consult and will follow along with you.   All the records are reviewed and case discussed with Consulting provider. Management plans discussed with the patient, family and they are in agreement.  CODE STATUS: Full code  TOTAL TIME TAKING CARE OF THIS PATIENT: 45 minutes.    Houston Siren M.D on 02/09/2017 at 11:51 AM  Between 7am to 6pm - Pager - [phone]  After 6pm go to [email or web address] - password [email or web address] [facility]  [facility] [facility]  Office  [phone]  CC: Primary care Physician: Marisue Ivan, MD

## 2017-02-10 DIAGNOSIS — N200 Calculus of kidney: Secondary | ICD-10-CM

## 2017-02-10 DIAGNOSIS — N132 Hydronephrosis with renal and ureteral calculous obstruction: Secondary | ICD-10-CM | POA: Diagnosis not present

## 2017-02-10 LAB — ECHOCARDIOGRAM COMPLETE
HEIGHTINCHES: 72 in
WEIGHTICAEL: 3275.2 [oz_av]

## 2017-02-10 LAB — HEMOGLOBIN A1C
Hgb A1c MFr Bld: 5.7 % — ABNORMAL HIGH (ref 4.8–5.6)
Mean Plasma Glucose: 117 mg/dL

## 2017-02-10 LAB — GLUCOSE, CAPILLARY: Glucose-Capillary: 102 mg/dL — ABNORMAL HIGH (ref 65–99)

## 2017-02-10 NOTE — Discharge Summary (Signed)
Date of admission: 02/09/2017  Date of discharge: 02/10/2017  Admission diagnosis: Left UPJ stone  Discharge diagnosis: Same as above, vasovagal episode  Secondary diagnoses:  Patient Active Problem List   Diagnosis Date Noted  . Kidney stone on left side 02/09/2017  . Special screening for malignant neoplasms, colon   . PVC (premature ventricular contraction)   . Atrial flutter/fibrillation 10/27/2011  . Syncope   . Atrial tachycardia ([place])   . Vertical nystagmus     History and Physical: For full details, please see admission history and physical. Briefly, Billy Carr is a 71 y.o. year old patient with .   Hospital Course: Patient tolerated the procedure well.  He was then transferred to the floor after an uneventful PACU stay. In the PACU, he had an episode of unresponsiveness thought to be secondary to vasovagal episode. He underwent extensive workup including cycling of cardiac enzymes, medicine and cardiology consult who recommended holding metoprolol and setting of bradycardia. Otherwise, his hospital course was uncomplicated.  On POD#*1 he had met discharge criteria: was eating a regular diet, was up and ambulating independently,  pain was well controlled, was voiding without a catheter, and was ready to for discharge.  Physical Exam  Constitutional: He is oriented to person, place, and time. He appears well-developed.  HENT:  Head: Normocephalic and atraumatic.  Cardiovascular: Normal rate.   Pulmonary/Chest: Effort normal.  Abdominal: Soft. Bowel sounds are normal.  Neurological: He is oriented to person, place, and time.  Skin: Skin is warm and dry.  Vitals reviewed.   Laboratory values:  No results for input(s): WBC, HGB, HCT in the last 72 hours.  Recent Labs  02/09/17 1100  NA 136  K 4.8  CL 102  CO2 29  GLUCOSE 227*  BUN 20  CREATININE 0.96  CALCIUM 8.3*   No results for input(s): LABPT, INR in the last 72 hours. No results for input(s): LABURIN  in the last 72 hours. Results for orders placed or performed in visit on 02/08/17  Microscopic Examination     Status: None   Collection Time: 02/08/17  1:27 PM  Result Value Ref Range Status   WBC, UA None seen 0 - 5 /hpf Final   RBC, UA 0-2 0 - 2 /hpf Final   Epithelial Cells (non renal) None seen 0 - 10 /hpf Final   Bacteria, UA None seen None seen/Few Final    Disposition: Home  Discharge instruction: See [facility] discharge instructions in chart.  Discharge medications:  Allergies as of 02/10/2017   No Known Allergies     Medication List    TAKE these medications   ELIQUIS 5 MG Tabs tablet Generic drug:  apixaban TAKE 1 TABLET(5 MG) BY MOUTH TWICE DAILY   HYDROcodone-acetaminophen 5-325 MG tablet Commonly known as:  NORCO/VICODIN Take 1-2 tablets by mouth every 6 (six) hours as needed for moderate pain.   metoprolol succinate 12.5 mg Tb24 24 hr tablet Commonly known as:  TOPROL-XL Take 0.5 tablets (12.5 mg total) by mouth every 12 (twelve) hours. What changed:  Another medication with the same name was removed. Continue taking this medication, and follow the directions you see here.   multivitamin tablet Take 1 tablet by mouth daily.   tamsulosin 0.4 MG Caps capsule Commonly known as:  FLOMAX Take 1 capsule by mouth daily.   vitamin C 500 MG tablet Commonly known as:  ASCORBIC ACID Take 500 mg by mouth daily.  Followup:  Follow-up Information     , MD Follow up in 2 week(s).   Specialty:  Urology Why:  any provider with [facility] prior Contact information: [address] [place] [address] [phone]           

## 2017-02-10 NOTE — Progress Notes (Signed)
02/10/2017 11:26 AM  Vaughan Basta to be D/C'd Home per MD order.  Discussed prescriptions and follow up appointments with the patient. Prescriptions given to patient, medication list explained in detail. Pt verbalized understanding.  Allergies as of 02/10/2017   No Known Allergies     Medication List    TAKE these medications   ELIQUIS 5 MG Tabs tablet Generic drug:  apixaban TAKE 1 TABLET(5 MG) BY MOUTH TWICE DAILY Notes to patient:  Last administered 02/10/17 morning   HYDROcodone-acetaminophen 5-325 MG tablet Commonly known as:  NORCO/VICODIN Take 1-2 tablets by mouth every 6 (six) hours as needed for moderate pain. Notes to patient:  Do not drive, operate heavy machinery, or drink alcohol while on this med   metoprolol succinate 12.5 mg Tb24 24 hr tablet Commonly known as:  TOPROL-XL Take 0.5 tablets (12.5 mg total) by mouth every 12 (twelve) hours. What changed:  Another medication with the same name was removed. Continue taking this medication, and follow the directions you see here.   multivitamin tablet Take 1 tablet by mouth daily. Notes to patient:  Last administered 02/10/17 morning   tamsulosin 0.4 MG Caps capsule Commonly known as:  FLOMAX Take 1 capsule by mouth daily. Notes to patient:  Last administered 02/09/17 afternoon   vitamin C 500 MG tablet Commonly known as:  ASCORBIC ACID Take 500 mg by mouth daily. Notes to patient:  Last administered 02/10/17 morning       Vitals:   02/10/17 0448 02/10/17 0800  BP: (!) 93/56 115/62  Pulse: (!) 55 62  Resp: 18 17  Temp: 97.7 F (36.5 C) 97.5 F (36.4 C)    Skin clean, dry and intact without evidence of skin break down, no evidence of skin tears noted. IV catheter discontinued intact. Site without signs and symptoms of complications. Dressing and pressure applied. Pt denies pain at this time. No complaints noted.  An After Visit Summary was printed and given to the patient. Patient escorted via WC, and  D/C home via private auto.  Bradly Chris

## 2017-02-10 NOTE — Progress Notes (Signed)
Patient ID: Billy Carr, male   DOB: Nov 10, 1945, 71 y.o.   MRN: [id number]  [employer] PROGRESS NOTE  Billy Carr WUJ:[id number] DOB: [birth year] DOA: 02/09/2017 PCP: Marisue Ivan, MD  HPI/Subjective: Patient feeling fine. His normal blood pressure is 100/70.  He is a runner ans usually very active.  He took a laxative and enema prior to procedure and had an episode of syncope.  Objective: Vitals:   02/09/17 2039 02/10/17 0448  BP: 114/62 (!) 93/56  Pulse: 64 (!) 55  Resp: 20 18  Temp: 97.9 F (36.6 C) 97.7 F (36.5 C)    Filed Weights   02/09/17 0745 02/09/17 1259  Weight: 93 kg (205 lb) 92.9 kg (204 lb 11.2 oz)    ROS: Review of Systems  Constitutional: Negative for chills and fever.  Eyes: Negative for blurred vision.  Respiratory: Negative for cough and shortness of breath.   Cardiovascular: Negative for chest pain.  Gastrointestinal: Negative for abdominal pain, constipation, diarrhea, nausea and vomiting.  Genitourinary: Negative for dysuria.  Musculoskeletal: Negative for joint pain.  Neurological: Negative for dizziness and headaches.   Exam: Physical Exam  Constitutional: He is oriented to person, place, and time.  HENT:  Nose: No mucosal edema.  Mouth/Throat: No oropharyngeal exudate or posterior oropharyngeal edema.  Eyes: Conjunctivae, EOM and lids are normal. Pupils are equal, round, and reactive to light.  Neck: No JVD present. Carotid bruit is not present. No edema present. No thyroid mass and no thyromegaly present.  Cardiovascular: S1 normal and S2 normal.  Exam reveals no gallop.   No murmur heard. Pulses:      Dorsalis pedis pulses are 2+ on the right side, and 2+ on the left side.  Respiratory: No respiratory distress. He has no wheezes. He has no rhonchi. He has no rales.  GI: Soft. Bowel sounds are normal. There is no tenderness.  Musculoskeletal:       Right ankle: He exhibits no swelling.       Left ankle: He exhibits no  swelling.  Lymphadenopathy:    He has no cervical adenopathy.  Neurological: He is alert and oriented to person, place, and time. No cranial nerve deficit.  Skin: Skin is warm. No rash noted. Nails show no clubbing.  Psychiatric: He has a normal mood and affect.      Data Reviewed: Basic Metabolic Panel:  Recent Labs Lab 02/09/17 1100 02/09/17 1709  NA 136  --   K 4.8  --   CL 102  --   CO2 29  --   GLUCOSE 227*  --   BUN 20  --   CREATININE 0.96  --   CALCIUM 8.3*  --   MG  --  2.4   Cardiac Enzymes:  Recent Labs Lab 02/09/17 1100 02/09/17 1709 02/09/17 2300  TROPONINI <0.03 <0.03 <0.03    CBG:  Recent Labs Lab 02/09/17 1101 02/09/17 1658 02/09/17 2114  GLUCAP 195* 119* 104*    Recent Results (from the past 240 hour(s))  Microscopic Examination     Status: None   Collection Time: 02/08/17  1:27 PM  Result Value Ref Range Status   WBC, UA None seen 0 - 5 /hpf Final   RBC, UA 0-2 0 - 2 /hpf Final   Epithelial Cells (non renal) None seen 0 - 10 /hpf Final   Bacteria, UA None seen None seen/Few Final     Studies: Dg Abd 1 View  Result Date: 02/09/2017 CLINICAL DATA:  Pre lithotripsy.  Left kidney stone. EXAM: ABDOMEN - 1 VIEW COMPARISON:  CT 02/03/2017 FINDINGS: 6 mm calcification projects over the lower pole of the left kidney, likely the previously seen proximal left ureteral stone. No other visible suspicious stones. Calcified phleboliths in the pelvis. Nonobstructive bowel gas pattern. No organomegaly. Levoscoliosis in the lumbar spine. No acute bony abnormality. IMPRESSION: 6 mm proximal left ureteral stone. Electronically Signed   By: Charlett Nose M.D.   On: 02/09/2017 08:13    Scheduled Meds: . apixaban  5 mg Oral BID  . docusate sodium  100 mg Oral BID  . insulin aspart  0-5 Units Subcutaneous QHS  . insulin aspart  0-9 Units Subcutaneous TID WC  . multivitamin with minerals  1 tablet Oral Daily  . tamsulosin  0.4 mg Oral Daily  . vitamin C   500 mg Oral Daily   Continuous Infusions: . sodium chloride 100 mL/hr at 02/10/17 0216    Assessment/Plan:  1. Syncope, likely vasovagal with Taking a laxative prior to procedure, medications with procedure and being on metoprolol. No arrhythmias overnight. Patient feels fine. Would check orthostatics and ambulate today. If still doing well can be discharged home. I would hold metoprolol at this point upon discharge secondary to bradycardia and relative hypotension. We will leave Flomax decision up to urology. Echocardiogram still pending.  2. History of atrial fibrillation but a normal sinus with him on Eliquis 3. Impaired fasting glucose. Patient is not a diabetic. Stop sliding scale. Hemoglobin A1c 5.7.  Code Status:     Code Status Orders        Start     Ordered   02/09/17 1325  Full code  Continuous     02/09/17 1324    Code Status History    Date Active Date Inactive Code Status Order ID Comments User Context   This patient has a current code status but no historical code status.    Advance Directive Documentation     Most Recent Value  Type of Advance Directive  Healthcare Power of Attorney  Pre-existing out of facility DNR order (yellow form or pink MOST form)  -  "MOST" Form in Place?  -     Family Communication: Permission speak in front of family Disposition Plan: Likely will be discharged today by urology  Consultants:  Cardiology  Urology  Procedures:  Lithotripsy  Time spent: 25 minutes  Alford Highland  [employer]

## 2017-02-15 ENCOUNTER — Emergency Department
Admission: EM | Admit: 2017-02-15 | Discharge: 2017-02-15 | Disposition: A | Discharge status: Home / Self Care | Payer: Medicare HMO | Attending: Emergency Medicine | Admitting: Emergency Medicine

## 2017-02-15 ENCOUNTER — Emergency Department: Payer: Medicare HMO

## 2017-02-15 ENCOUNTER — Encounter: Payer: Self-pay | Admitting: Emergency Medicine

## 2017-02-15 DIAGNOSIS — R109 Unspecified abdominal pain: Secondary | ICD-10-CM | POA: Diagnosis present

## 2017-02-15 DIAGNOSIS — Z79899 Other long term (current) drug therapy: Secondary | ICD-10-CM | POA: Insufficient documentation

## 2017-02-15 DIAGNOSIS — N23 Unspecified renal colic: Secondary | ICD-10-CM | POA: Diagnosis not present

## 2017-02-15 LAB — CBC
HCT: 40.9 % (ref 40.0–52.0)
Hemoglobin: 13.4 g/dL (ref 13.0–18.0)
MCH: 29.7 pg (ref 26.0–34.0)
MCHC: 32.9 g/dL (ref 32.0–36.0)
MCV: 90.4 fL (ref 80.0–100.0)
Platelets: 169 10*3/uL (ref 150–440)
RBC: 4.52 MIL/uL (ref 4.40–5.90)
RDW: 12.6 % (ref 11.5–14.5)
WBC: 12.9 10*3/uL — ABNORMAL HIGH (ref 3.8–10.6)

## 2017-02-15 LAB — URINALYSIS, ROUTINE W REFLEX MICROSCOPIC
BACTERIA UA: NONE SEEN
Bilirubin Urine: NEGATIVE
GLUCOSE, UA: NEGATIVE mg/dL
Ketones, ur: 5 mg/dL — AB
Leukocytes, UA: NEGATIVE
NITRITE: NEGATIVE
PROTEIN: 30 mg/dL — AB
Specific Gravity, Urine: 1.024 (ref 1.005–1.030)
Squamous Epithelial / LPF: NONE SEEN
pH: 7 (ref 5.0–8.0)

## 2017-02-15 LAB — COMPREHENSIVE METABOLIC PANEL
ALBUMIN: 4.2 g/dL (ref 3.5–5.0)
ALK PHOS: 76 U/L (ref 38–126)
ALT: 18 U/L (ref 17–63)
AST: 31 U/L (ref 15–41)
Anion gap: 9 (ref 5–15)
BILIRUBIN TOTAL: 0.9 mg/dL (ref 0.3–1.2)
BUN: 28 mg/dL — AB (ref 6–20)
CALCIUM: 8.8 mg/dL — AB (ref 8.9–10.3)
CO2: 26 mmol/L (ref 22–32)
Chloride: 101 mmol/L (ref 101–111)
Creatinine, Ser: 1.23 mg/dL (ref 0.61–1.24)
GFR calc Af Amer: >60 mL/min (ref >60)
GFR calc non Af Amer: 57 mL/min — ABNORMAL LOW (ref >60)
GLUCOSE: 133 mg/dL — AB (ref 65–99)
Potassium: 4.3 mmol/L (ref 3.5–5.1)
Sodium: 136 mmol/L (ref 135–145)
TOTAL PROTEIN: 7.5 g/dL (ref 6.5–8.1)

## 2017-02-15 MED ORDER — KETOROLAC TROMETHAMINE 10 MG PO TABS: 10.0000 mg | ORAL_TABLET | Freq: Three times a day (TID) | ORAL | 0 refills | Status: DC | PRN

## 2017-02-15 MED ORDER — SODIUM CHLORIDE 0.9 % IV BOLUS (SEPSIS): 1000.0000 mL | Freq: Once | INTRAVENOUS | Status: DC

## 2017-02-15 MED ORDER — ONDANSETRON 4 MG PO TBDP: 4.0000 mg | ORAL_TABLET | Freq: Three times a day (TID) | ORAL | 0 refills | Status: DC | PRN

## 2017-02-15 MED ORDER — SODIUM CHLORIDE 0.9 % IV BOLUS (SEPSIS)
1000.0000 mL | Freq: Once | INTRAVENOUS | Status: AC
  Administered 2017-02-15: 1000 mL via INTRAVENOUS

## 2017-02-15 MED ORDER — SODIUM CHLORIDE 0.9 % IV BOLUS (SEPSIS)
1000.0000 mL | Freq: Once | INTRAVENOUS | Status: DC
  Administered 2017-02-15: 1000 mL via INTRAVENOUS

## 2017-02-15 MED ORDER — ONDANSETRON HCL 4 MG/2ML IJ SOLN
4.0000 mg | Freq: Once | INTRAMUSCULAR | Status: AC
  Administered 2017-02-15: 4 mg via INTRAVENOUS
  Filled 2017-02-15: qty 2

## 2017-02-15 MED ORDER — KETOROLAC TROMETHAMINE 30 MG/ML IJ SOLN
30.0000 mg | Freq: Once | INTRAMUSCULAR | Status: AC
  Administered 2017-02-15: 30 mg via INTRAVENOUS
  Filled 2017-02-15: qty 1

## 2017-02-15 NOTE — ED Provider Notes (Signed)
[facility] Emergency Department Provider Note  ____________________________________________  Time seen: Approximately 10:35 AM  I have reviewed the triage vital signs and the nursing notes.   HISTORY  Chief Complaint Flank Pain    HPI Billy Carr is a 71 y.o. male with a history of A. fib and flutter on our request, renal colic with recent lithotripsy, presenting with left flank pain, nausea and vomiting. The patient reports that he underwent lithotripsy by Dr. Vanna Scotland last Thursday for a left renal stone. He was doing well until 2 PM yesterday when he had acute onset of a severe left flank pain associated with nausea and vomiting which persisted throughout the evening. He also feels that his urinary stream has significantly decreased over the past 12 hours. This morning at 7 AM he took 2 hydrocodone, which improved his pain for 3 hours, but then the pain came back.Mild constipation but no diarrhea. No fevers or chills. No hematuria.   Past Medical History:  Diagnosis Date  . Atrial flutter/fib 10/27/2011   flecainide::CHADSVASc--1>> no anticoag 2/14  . Atrial tachycardia ([facility])   . 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 History of shingles   . Nephrolithiasis   . PVC (premature ventricular contraction)    asymptomatic  . Syncope    Neurally mediated-presumed::also ?assoc with AFib  . Vertical nystagmus    no recent issues  . Wears dentures    full upper, partial lower    Patient Active Problem List   Diagnosis Date Noted  . Kidney stone on left side 02/09/2017  . Special screening for malignant neoplasms, colon   . PVC (premature ventricular contraction)   . Atrial flutter/fibrillation 10/27/2011  . Syncope   . Atrial tachycardia ([facility])   . Vertical nystagmus     Past Surgical History:  Procedure Laterality Date  . CARDIAC ELECTROPHYSIOLOGY STUDY AND ABLATION  2015, 2016  . COLONOSCOPY WITH PROPOFOL N/A 10/03/2016   Procedure: COLONOSCOPY WITH PROPOFOL;  Surgeon: Midge Minium, MD;  Location: [facility];  Service: Endoscopy;  Laterality: N/A;  . EXTRACORPOREAL SHOCK WAVE LITHOTRIPSY Left 02/09/2017   Procedure: EXTRACORPOREAL SHOCK WAVE LITHOTRIPSY (ESWL);  Surgeon: Vanna Scotland, MD;  Location: [facility];  Service: Urology;  Laterality: Left;  . HERNIA REPAIR     bilateral  . NEPHROSTOMY     open, left kidney stone  . SQUAMOUS CELL CARCINOMA EXCISION     Head    Current Outpatient Rx  . Order #: [id number] Class: Historical Med  . Order #: [id number] Class: Print  . Order #: [id number] Class: Print  . Order #: [id number] Class: Print  . Order #: [id number] Class: Historical Med  . Order #: [id number] Class: Print  . Order #: [id number] Class: Historical Med  . Order #: [id number] Class: Historical Med    Allergies Patient has no known allergies.  Family History  Problem Relation Age of Onset  . Heart failure Mother     Social History Social History  Substance Use Topics  . Smoking status: Never Smoker  . Smokeless tobacco: Never Used  . Alcohol use No    Review of Systems Constitutional: No fever/chills.No lightheadedness or syncope. Eyes: No visual changes. ENT: No sore throat. No congestion or rhinorrhea. Cardiovascular: Denies chest pain. Denies palpitations. Respiratory: Denies shortness of breath.  No cough. Gastrointestinal: No abdominal pain.  Positive left CVA tenderness. Positive nausea, positive vomiting.  No diarrhea.  Positive mild constipation. Genitourinary: Negative for dysuria. No hematuria. Positive decreased urinary stream. Musculoskeletal: Positive  for left CVA tenderness.. Skin: Negative for rash. Neurological: Negative for headaches. No focal numbness, tingling or weakness.   10-point ROS otherwise negative.  ____________________________________________   PHYSICAL EXAM:  VITAL SIGNS: ED Triage Vitals  Enc Vitals Group     BP 02/15/17 0950 122/64     Pulse  Rate 02/15/17 0950 66     Resp 02/15/17 0950 18     Temp 02/15/17 0950 98.4 F (36.9 C)     Temp Source 02/15/17 0950 Oral     SpO2 02/15/17 0950 100 %     Weight 02/15/17 0951 205 lb (93 kg)     Height 02/15/17 0951 6' (1.829 m)     Head Circumference --      Peak Flow --      Pain Score 02/15/17 0958 7     Pain Loc --      Pain Edu? --      Excl. in GC? --     Constitutional: Alert and oriented. Well appearing and in no acute distress. Answers questions appropriately. Eyes: Conjunctivae are normal.  EOMI. No scleral icterus. Head: Atraumatic. Nose: No congestion/rhinnorhea. Mouth/Throat: Mucous membranes are moist.  Neck: No stridor.  Supple.   Cardiovascular: Normal rate, regular rhythm. No murmurs, rubs or gallops.  Respiratory: Normal respiratory effort.  No accessory muscle use or retractions. Lungs CTAB.  No wheezes, rales or ronchi. Gastrointestinal: Soft, nontender and nondistended.  Positive left CVA tenderness to palpation, mild. No guarding or rebound.  No peritoneal signs. Musculoskeletal: No LE edema.  Neurologic:  A&Ox3.  Speech is clear.  Face and smile are symmetric.  EOMI.  Moves all extremities well. Skin:  Skin is warm, dry and intact. No rash noted. Psychiatric: Mood and affect are normal. Speech and behavior are normal.  Normal judgement.  ____________________________________________   LABS (all labs ordered are listed, but only abnormal results are displayed)  Labs Reviewed  COMPREHENSIVE METABOLIC PANEL - Abnormal; Notable for the following:       Result Value   Glucose, Bld 133 (*)    BUN 28 (*)    Calcium 8.8 (*)    GFR calc non Af Amer 57 (*)    All other components within normal limits  CBC - Abnormal; Notable for the following:    WBC 12.9 (*)    All other components within normal limits  URINALYSIS, ROUTINE W REFLEX MICROSCOPIC - Abnormal; Notable for the following:    Color, Urine YELLOW (*)    APPearance HAZY (*)    Hgb urine dipstick  MODERATE (*)    Ketones, ur 5 (*)    Protein, ur 30 (*)    All other components within normal limits   ____________________________________________  EKG  Not indicated ____________________________________________  RADIOLOGY  Ct Renal Stone Study  Result Date: 02/15/2017 CLINICAL DATA:  Left flank pain. EXAM: CT ABDOMEN AND PELVIS WITHOUT CONTRAST TECHNIQUE: Multidetector CT imaging of the abdomen and pelvis was performed following the standard protocol without IV contrast. COMPARISON:  None. FINDINGS: Lower chest: Left base atelectasis. Heart is borderline in size. Coronary artery and valvular calcifications. Hepatobiliary: No focal hepatic abnormality. Gallbladder unremarkable. Pancreas: No focal abnormality or ductal dilatation. Spleen: No focal abnormality.  Normal size. Adrenals/Urinary Tract: 4 mm left UVJ stone with moderate left hydronephrosis. Left lower pole nonobstructing renal stone. No stones on the right. Adrenal glands and urinary bladder unremarkable. Stomach/Bowel: Moderate stool throughout the colon. Sigmoid diverticulosis. Stomach and small bowel decompressed. Vascular/Lymphatic: Aortoiliac atherosclerosis.  No aneurysm or adenopathy. Reproductive: Prostate enlargement. Other: No free fluid or free air. Musculoskeletal: No acute bony abnormality or focal bone lesion. IMPRESSION: 4 mm left UVJ stone with moderate left hydronephrosis. Left lower pole nephrolithiasis. Coronary artery disease, aortic atherosclerosis. Prostate enlargement. Sigmoid diverticulosis.  Moderate stool burden in the colon. Electronically Signed   By: Charlett Nose M.D.   On: 02/15/2017 11:09    ____________________________________________   PROCEDURES  Procedure(s) performed: None  Procedures  Critical Care performed: No ____________________________________________   INITIAL IMPRESSION / ASSESSMENT AND PLAN / ED COURSE  Pertinent labs & imaging results that were available during my care of the  patient were reviewed by me and considered in my medical decision making (see chart for details).  71 y.o. male with recent lithotripsy for left renal stone presenting with acute onset of left flank pain, nausea and vomiting, decreased urinary stream. It is possible that the patient has renal stones which are symptomatic due to movement. We will check a creatinine, for infection, make sure that he is having adequate urine output here. Plan reevaluation for final disposition.  ----------------------------------------- 12:07 PM on 02/15/2017 -----------------------------------------  At this time, the patient has complete resolution of his symptoms. He is no longer in pain and is able to tolerate liquid without difficulty. His CT scan does show 4 mm stone at the left UVJ. He has a mild bump in his creatinine, but it is still within normal limits. His white blood cell count is 12.9 but he does not have signs of infection in his urine. I've spoken with Dr. Apolinar Junes, his primary urologist, who recommends discharge home with outpatient follow-up. He will be discharged with prescriptions for symptomatic treatment, and return precautions.  ____________________________________________  FINAL CLINICAL IMPRESSION(S) / ED DIAGNOSES  Final diagnoses:  Left flank pain  Renal colic on left side         NEW MEDICATIONS STARTED DURING THIS VISIT:  New Prescriptions   KETOROLAC (TORADOL) 10 MG TABLET    Take 1 tablet (10 mg total) by mouth every 8 (eight) hours as needed for moderate pain (with food).   ONDANSETRON (ZOFRAN ODT) 4 MG DISINTEGRATING TABLET    Take 1 tablet (4 mg total) by mouth every 8 (eight) hours as needed for nausea or vomiting.      Rockne Menghini, MD 02/15/17 1209

## 2017-02-15 NOTE — ED Notes (Signed)
Patient transported to CT 

## 2017-02-15 NOTE — ED Triage Notes (Signed)
L flank pain x 1 week, history of kidney stones.

## 2017-02-15 NOTE — Discharge Instructions (Signed)
Please drink plenty of fluids stay well-hydrated and to help your kidney stone pass. You may take Toradol for mild to moderate pain. Do not take other NSAID medications such as naproxen, Aleve, ibuprofen, Motrin or Advil when you're taking this medication. Please take Toradol with a small amount of food to decrease irritation of the stomach.  Return to the emergency department if you have pain that is not controlled with medication, inability to keep down fluids, fever, or any other symptoms concerning to you.

## 2017-02-20 ENCOUNTER — Ambulatory Visit: Payer: Commercial Managed Care - HMO

## 2017-02-21 ENCOUNTER — Encounter: Payer: Self-pay | Admitting: Cardiovascular Disease

## 2017-02-21 ENCOUNTER — Ambulatory Visit (INDEPENDENT_AMBULATORY_CARE_PROVIDER_SITE_OTHER): Payer: Medicare HMO | Admitting: Cardiovascular Disease

## 2017-02-21 VITALS — BP 106/62 | HR 64 | Ht 72.0 in | Wt 203.0 lb

## 2017-02-21 DIAGNOSIS — I4819 Other persistent atrial fibrillation: Secondary | ICD-10-CM

## 2017-02-21 DIAGNOSIS — Z87898 Personal history of other specified conditions: Secondary | ICD-10-CM

## 2017-02-21 DIAGNOSIS — I481 Persistent atrial fibrillation: Secondary | ICD-10-CM

## 2017-02-21 MED ORDER — ASPIRIN EC 81 MG PO TBEC: 81.0000 mg | DELAYED_RELEASE_TABLET | Freq: Every day | ORAL | 3 refills | Status: DC

## 2017-02-21 NOTE — Patient Instructions (Signed)
Medication Instructions:  Your physician has recommended you make the following change in your medication:  STOP taking metoprolol STOP taking eliquis START taking 81mg  aspirin   Labwork: none  Testing/Procedures: none  Follow-Up: Your physician wants you to follow-up in: 6 months with Dr. Kirke CorinArida.  You will receive a reminder letter in the mail two months in advance. If you don't receive a letter, please call our office to schedule the follow-up appointment.   Any Other Special Instructions Will Be Listed Below (If Applicable).     If you need a refill on your cardiac medications before your next appointment, please call your pharmacy.

## 2017-02-21 NOTE — Progress Notes (Signed)
Cardiology Office Note   Date:  02/21/2017   ID:  Billy Carr, DOB 08-25-1946, MRN [id number]  PCP:  Marisue Ivan, MD  Cardiologist:   Lorine Bears, MD   Chief Complaint  Patient presents with  . OTHER    6 month f/u no complaints today. Meds reviewed verbally with pt.      History of Present Illness: Billy Carr is a 71 y.o. male who presents for a follow-up visit regarding persistent atrial fibrillation status post RF ablation in 2015 and 2016 at [facility]. He had a treadmill nuclear stress test in August 2017 which showed no evidence of ischemia with normal ejection fraction. Echocardiogram in September 2017 showed normal LV systolic function, mild mitral regurgitation and mildly dilated aortic root at 39 mm.  He presented recently for an Shock wave lithotripsy and had a syncopal episode in the recovery area after he was given a laxative. No palpitations. He was noted to be bradycardic. Metoprolol was discontinued but it appears that it was resumed before hospital discharge. He underwent a repeat echocardiogram which showed normal LV systolic function, mild mitral and tricuspid regurgitation and mild pulmonary hypertension. He has been doing well and denies any chest pain, shortness of breath or palpitations.  Past Medical History:  Diagnosis Date  . Atrial flutter/fib 10/27/2011   flecainide::CHADSVASc--1>> no anticoag 2/14  . Atrial tachycardia (HCC)   . 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 History of shingles   . Nephrolithiasis   . PVC (premature ventricular contraction)    asymptomatic  . Syncope    Neurally mediated-presumed::also ?assoc with AFib  . Vertical nystagmus    no recent issues  . Wears dentures    full upper, partial lower    Past Surgical History:  Procedure Laterality Date  . CARDIAC ELECTROPHYSIOLOGY STUDY AND ABLATION  2015, 2016  . COLONOSCOPY WITH PROPOFOL N/A 10/03/2016   Procedure: COLONOSCOPY WITH PROPOFOL;  Surgeon:  Midge Minium, MD;  Location: [facility];  Service: Endoscopy;  Laterality: N/A;  . EXTRACORPOREAL SHOCK WAVE LITHOTRIPSY Left 02/09/2017   Procedure: EXTRACORPOREAL SHOCK WAVE LITHOTRIPSY (ESWL);  Surgeon: Vanna Scotland, MD;  Location: [facility];  Service: Urology;  Laterality: Left;  . HERNIA REPAIR     bilateral  . NEPHROSTOMY     open, left kidney stone  . SQUAMOUS CELL CARCINOMA EXCISION     Head     Current Outpatient Prescriptions  Medication Sig Dispense Refill  . Multiple Vitamin (MULTIVITAMIN) tablet Take 1 tablet by mouth daily.      . tamsulosin (FLOMAX) 0.4 MG CAPS capsule Take 1 capsule by mouth daily.    . vitamin C (ASCORBIC ACID) 500 MG tablet Take 500 mg by mouth daily.    Marland Kitchen aspirin EC 81 MG tablet Take 1 tablet (81 mg total) by mouth daily. 90 tablet 3   No current facility-administered medications for this visit.     Allergies:   Patient has no known allergies.    Social History:  The patient  reports that he has never smoked. He has never used smokeless tobacco. He reports that he does not drink alcohol or use drugs.'s   Family History:  The patient's family history includes Heart failure in his mother.    ROS:  Please see the history of present illness.   Otherwise, review of systems are positive for none.   All other systems are reviewed and negative.    PHYSICAL EXAM: VS:  BP 106/62 (BP Location: Left Arm, Patient Position: Sitting, Cuff Size: Normal)   Pulse 64   Ht 6' (1.829 m)   Wt 203 lb (92.1 kg)   BMI 27.53 kg/m  , BMI Body mass index is 27.53 kg/m. GEN: Well nourished, well developed, in no acute distress  HEENT: normal  Neck: no JVD, carotid bruits, or masses Cardiac: RRR with premature beats ; no murmurs, rubs, or gallops,no edema  Respiratory:  clear to auscultation bilaterally, normal work of breathing GI: soft, nontender, nondistended, + BS MS: no deformity or atrophy  Skin: warm and dry, no rash Neuro:  Strength and sensation  are intact Psych: euthymic mood, full affect   EKG:  EKG is ordered today. The ekg ordered today demonstrates sinus rhythm with sinus arrhythmia, left anterior fascicular block.   Recent Labs: 02/09/2017: Magnesium 2.4; TSH 0.580 02/15/2017: ALT 18; BUN 28; Creatinine, Ser 1.23; Hemoglobin 13.4; Platelets 169; Potassium 4.3; Sodium 136    Lipid Panel    Component Value Date/Time   CHOL 130 08/01/2012 0036   TRIG 77 08/01/2012 0036   HDL 34 (L) 08/01/2012 0036   CHOLHDL 4.9 12/14/2010 0334   VLDL 15 08/01/2012 0036   LDLCALC 81 08/01/2012 0036      Wt Readings from Last 3 Encounters:  02/21/17 203 lb (92.1 kg)  02/15/17 205 lb (93 kg)  02/09/17 204 lb 11.2 oz (92.9 kg)       No flowsheet data found.    ASSESSMENT AND PLAN:  1.  Atrial fibrillation status post RF ablation:  No evidence of recurrent arrhythmia. CHADS VASc score is 1 . Due to that, I think it's reasonable to stop anticoagulation for now. If he develops recurrent atrial fibrillation after the age of 71, anticoagulation can be resumed.  2. Recent vasovagal syncope: No recurrent episodes. I elected to discontinue small dose metoprolol especially with underlying bradycardia.  3. History of orthostatic hypotension, stable.   Disposition:   FU with me in 6 months  Signed,  Lorine BearsMuhammad Arida, MD  02/21/2017 3:43 PM    [facility]

## 2017-02-28 ENCOUNTER — Ambulatory Visit
Admission: RE | Admit: 2017-02-28 | Discharge: 2017-02-28 | Disposition: A | Discharge status: Home / Self Care | Payer: Medicare HMO | Source: Ambulatory Visit | Attending: Urology | Admitting: Urology

## 2017-02-28 DIAGNOSIS — M619 Calcification and ossification of muscle, unspecified: Secondary | ICD-10-CM | POA: Diagnosis not present

## 2017-02-28 DIAGNOSIS — N200 Calculus of kidney: Secondary | ICD-10-CM | POA: Diagnosis not present

## 2017-03-01 ENCOUNTER — Ambulatory Visit (INDEPENDENT_AMBULATORY_CARE_PROVIDER_SITE_OTHER): Payer: Medicare HMO | Admitting: Urology

## 2017-03-01 ENCOUNTER — Encounter: Payer: Self-pay | Admitting: Urology

## 2017-03-01 VITALS — BP 101/56 | HR 87 | Ht 72.0 in | Wt 205.0 lb

## 2017-03-01 DIAGNOSIS — N201 Calculus of ureter: Secondary | ICD-10-CM | POA: Diagnosis not present

## 2017-03-01 DIAGNOSIS — N200 Calculus of kidney: Secondary | ICD-10-CM

## 2017-03-01 NOTE — Progress Notes (Signed)
03/01/2017 2:05 PM   Rica Records 1946-09-29 [id number]  Referring provider: Dion Body, MD [facility] [place], [place] [address]  Chief Complaint  Patient presents with  . Nephrolithiasis    2wk w/KUB    HPI: 71 year old male who presents today 2 weeks status post left ESWL for treatment of a six mmr left proximal ureteral stone.  Immediately following the procedure, he had a vasovagal episode and was kept overnight for observation. He remained stable and was ultimately discharged the following day.  He had no issues until 02/15/2017 when he developed acute onset left flank pain. He had significant associated nausea and vomiting. He presented to the emergency room for further evaluation revealing a 4 mm left distal ureteral fragment. He was otherwise hemodynamically stable and ultimately his pain was able to be controlled. He passes fragment several days later but forgot to bring it with him today.  He's had no pain since that time. He denies any urinary symptoms. No gross hematuria, dysuria, fevers, or chills.  He does have a remote history of nephrolithiasis including an open ureteral lithotomy in the 1980s. No stone episodes since that time.  He does note that he is an avid runner and often gets dehydrated. He tries to drink plain water but certainly does not void 2.5 L daily.   PMH: Past Medical History:  Diagnosis Date  . Atrial flutter/fib 10/27/2011   flecainide::CHADSVASc--1>> no anticoag 2/14  . Atrial tachycardia ([place])   . 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 History of shingles   . Nephrolithiasis   . PVC (premature ventricular contraction)    asymptomatic  . Syncope    Neurally mediated-presumed::also ?assoc with AFib  . Vertical nystagmus    no recent issues  . Wears dentures    full upper, partial lower    Surgical History: Past Surgical History:  Procedure Laterality Date  . CARDIAC ELECTROPHYSIOLOGY  STUDY AND ABLATION  2015, 2016  . COLONOSCOPY WITH PROPOFOL N/A 10/03/2016   Procedure: COLONOSCOPY WITH PROPOFOL;  Surgeon: Lucilla Lame, MD;  Location: [facility];  Service: Endoscopy;  Laterality: N/A;  . EXTRACORPOREAL SHOCK WAVE LITHOTRIPSY Left 02/09/2017   Procedure: EXTRACORPOREAL SHOCK WAVE LITHOTRIPSY (ESWL);  Surgeon: Hollice Espy, MD;  Location: [facility];  Service: Urology;  Laterality: Left;  . HERNIA REPAIR     bilateral  . NEPHROSTOMY     open, left kidney stone  . SQUAMOUS CELL CARCINOMA EXCISION     Head    Home Medications:  Allergies as of 03/01/2017   No Known Allergies     Medication List       Accurate as of 03/01/17  2:05 PM. Always use your most recent med list.          aspirin EC 81 MG tablet Take 1 tablet (81 mg total) by mouth daily.   multivitamin tablet Take 1 tablet by mouth daily.   tamsulosin 0.4 MG Caps capsule Commonly known as:  FLOMAX Take 1 capsule by mouth daily.   vitamin C 500 MG tablet Commonly known as:  ASCORBIC ACID Take 500 mg by mouth daily.       Allergies: No Known Allergies  Family History: Family History  Problem Relation Age of Onset  . Heart failure Mother     Social History:  reports that he has never smoked. He has never used smokeless tobacco. He reports that he does not drink alcohol or use drugs.  ROS: UROLOGY Frequent Urination?: No Hard to postpone urination?: No Burning/pain with urination?: No Get up at night to urinate?: No Leakage of urine?: No Urine stream starts and stops?: No Trouble starting stream?: No Do you have to strain to urinate?: No Blood in urine?: No Urinary tract infection?: No Sexually transmitted disease?: No Injury to kidneys or bladder?: No Painful intercourse?: No Weak stream?: No Erection problems?: No Penile pain?: No  Gastrointestinal Nausea?: No Vomiting?: No Indigestion/heartburn?: No Diarrhea?: No Constipation?: No  Constitutional Fever:  No Night sweats?: No Weight loss?: No Fatigue?: No  Skin Skin rash/lesions?: No Itching?: No  Eyes Blurred vision?: No Double vision?: No  Ears/Nose/Throat Sore throat?: No Sinus problems?: No  Hematologic/Lymphatic Swollen glands?: No Easy bruising?: No  Cardiovascular Leg swelling?: No Chest pain?: No  Respiratory Cough?: No Shortness of breath?: No  Endocrine Excessive thirst?: No  Musculoskeletal Back pain?: No Joint pain?: No  Neurological Headaches?: No Dizziness?: No  Psychologic Depression?: No Anxiety?: No  Physical Exam: BP (!) 101/56   Pulse 87   Ht 6' (1.829 m)   Wt 205 lb (93 kg)   BMI 27.80 kg/m   Constitutional:  Alert and oriented, No acute distress. HEENT:  AT, moist mucus membranes.  Trachea midline, no masses. Cardiovascular: No clubbing, cyanosis, or edema. Respiratory: Normal respiratory effort, no increased work of breathing. GI: Abdomen is soft, nontender, nondistended, no abdominal masses GU: No CVA tenderness.  Skin: No rashes, bruises or suspicious lesions. Neurologic: Grossly intact, no focal deficits, moving all 4 extremities. Psychiatric: Normal mood and affect.  Laboratory Data: Lab Results  Component Value Date   WBC 12.9 (H) 02/15/2017   HGB 13.4 02/15/2017   HCT 40.9 02/15/2017   MCV 90.4 02/15/2017   PLT 169 02/15/2017    Lab Results  Component Value Date   CREATININE 1.23 02/15/2017    Lab Results  Component Value Date   HGBA1C 5.7 (H) 02/09/2017    Urinalysis    Component Value Date/Time   COLORURINE YELLOW (A) 02/15/2017 1014   APPEARANCEUR HAZY (A) 02/15/2017 1014   APPEARANCEUR Clear 02/08/2017 1327   LABSPEC 1.024 02/15/2017 1014   PHURINE 7.0 02/15/2017 1014   GLUCOSEU NEGATIVE 02/15/2017 1014   HGBUR MODERATE (A) 02/15/2017 1014   BILIRUBINUR NEGATIVE 02/15/2017 1014   BILIRUBINUR Negative 02/08/2017 1327   KETONESUR 5 (A) 02/15/2017 1014   PROTEINUR 30 (A) 02/15/2017 1014    UROBILINOGEN 0.2 12/14/2010 0334   NITRITE NEGATIVE 02/15/2017 1014   LEUKOCYTESUR NEGATIVE 02/15/2017 1014   LEUKOCYTESUR Negative 02/08/2017 1327    Pertinent Imaging: CLINICAL DATA:  Lithotripsy.  EXAM: ABDOMEN - 1 VIEW  COMPARISON:  02/15/2017 .  FINDINGS: Soft tissue structures are unremarkable. Fragmentation of previously identified 6 mm left renal stone. Tiny 2 mm fragments remain. Pelvic calcifications consistent with phleboliths are noted. Distal ureteral stone cannot be completely excluded.  IMPRESSION: 1. Fragmentation of previously identified 6 mm left renal stone. Tiny 2 mm fragments remain over the left kidney.  2. Stable pelvic calcifications consistent phleboliths. Distal ureteral stones cannot be excluded .   Electronically Signed   By: Marcello Moores  Register   On: 02/28/2017 15:55  KUB imaging personally reviewed today. This was compared to his previous 2 CT scans on 02/15/2017 as well as 02/03/2017. No evidence of residual distal ureteral stone.  Assessment & Plan:   1. Left ureteral stone s/p successful left ESWL [place] fragments measuring 2 mm over the left kidney Clinically improved Recommend follow-up  of the small fragments in 6 months with KUB, return sooner 12's flank pain - Abdomen 1 view (KUB); Future  2. Recurrent nephrolithiasis We discussed general stone prevention techniques including drinking plenty water with goal of producing 2.5 L urine daily, increased citric acid intake, avoidance of high oxalate containing foods, and decreased salt intake.  Information about dietary recommendations given today.    Return in about 6 months (around 09/01/2017) for KUB.  Hollice Espy, MD  [facility] [address] [place], [place] [address] [phone]

## 2017-09-01 ENCOUNTER — Ambulatory Visit: Payer: Medicare HMO | Admitting: Urology

## 2018-01-11 ENCOUNTER — Ambulatory Visit: Payer: Medicare HMO | Admitting: Cardiovascular Disease

## 2018-01-11 ENCOUNTER — Encounter: Payer: Self-pay | Admitting: Cardiovascular Disease

## 2018-01-11 VITALS — BP 114/72 | HR 70 | Ht 72.0 in | Wt 209.0 lb

## 2018-01-11 DIAGNOSIS — I4819 Other persistent atrial fibrillation: Secondary | ICD-10-CM

## 2018-01-11 DIAGNOSIS — R55 Syncope and collapse: Secondary | ICD-10-CM | POA: Diagnosis not present

## 2018-01-11 DIAGNOSIS — I481 Persistent atrial fibrillation: Secondary | ICD-10-CM

## 2018-01-11 NOTE — Progress Notes (Signed)
Cardiology Office Note   Date:  01/11/2018   ID:  Billy Carr, DOB [birth year], MRN [id number]  PCP:  Marisue Ivan, MD  Cardiologist:   Lorine Bears, MD   Chief Complaint  Patient presents with  . Other    6 month follow up. Patient denies chest pain and SOB. Meds reviewed verbally with patient.       History of Present Illness: Billy Carr is a 72 y.o. male who presents for a follow-up visit regarding persistent atrial fibrillation status post RF ablation in 2015 and 2016 at [facility]. He had a treadmill nuclear stress test in August 2017 which showed no evidence of ischemia with normal ejection fraction. Echocardiogram in September 2017 showed normal LV systolic function, mild mitral regurgitation and mildly dilated aortic root at 39 mm.  He had an episode of syncope while in the recovery area after lithotripsy that was felt to be vasovagal.  Also he was bradycardic on metoprolol. Echocardiogram showed normal LV systolic function, mild mitral and tricuspid regurgitation and mild pulmonary hypertension.   Metoprolol was discontinued at that time with no recurrent palpitations.  No chest pain or shortness of breath.  He has been doing very well.  Past Medical History:  Diagnosis Date  . Atrial flutter/fib 10/27/2011   flecainide::CHADSVASc--1>> no anticoag 2/14  . Atrial tachycardia (HCC)   . 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 History of shingles   . Nephrolithiasis   . PVC (premature ventricular contraction)    asymptomatic  . Syncope    Neurally mediated-presumed::also ?assoc with AFib  . Vertical nystagmus    no recent issues  . Wears dentures    full upper, partial lower    Past Surgical History:  Procedure Laterality Date  . CARDIAC ELECTROPHYSIOLOGY STUDY AND ABLATION  2015, 2016  . COLONOSCOPY WITH PROPOFOL N/A 10/03/2016   Procedure: COLONOSCOPY WITH PROPOFOL;  Surgeon: Midge Minium, MD;  Location: [facility];  Service: Endoscopy;   Laterality: N/A;  . EXTRACORPOREAL SHOCK WAVE LITHOTRIPSY Left 02/09/2017   Procedure: EXTRACORPOREAL SHOCK WAVE LITHOTRIPSY (ESWL);  Surgeon: Vanna Scotland, MD;  Location: [facility];  Service: Urology;  Laterality: Left;  . HERNIA REPAIR     bilateral  . NEPHROSTOMY     open, left kidney stone  . SQUAMOUS CELL CARCINOMA EXCISION     Head     Current Outpatient Medications  Medication Sig Dispense Refill  . aspirin EC 81 MG tablet Take 1 tablet (81 mg total) by mouth daily. 90 tablet 3  . Multiple Vitamin (MULTIVITAMIN) tablet Take 1 tablet by mouth daily.      . vitamin C (ASCORBIC ACID) 500 MG tablet Take 500 mg by mouth daily.     No current facility-administered medications for this visit.     Allergies:   Patient has no known allergies.    Social History:  The patient  reports that he has never smoked. He has never used smokeless tobacco. He reports that he does not drink alcohol or use drugs.'s   Family History:  The patient's family history includes Heart failure in his mother.    ROS:  Please see the history of present illness.   Otherwise, review of systems are positive for none.   All other systems are reviewed and negative.    PHYSICAL EXAM: VS:  BP 114/72 (BP Location: Left Arm, Patient Position: Sitting, Cuff Size: Normal)   Pulse 70   Ht 6' (1.829 m)  Wt 209 lb (94.8 kg)   BMI 28.35 kg/m  , BMI Body mass index is 28.35 kg/m. GEN: Well nourished, well developed, in no acute distress  HEENT: normal  Neck: no JVD, carotid bruits, or masses Cardiac: RRR with premature beats ; no murmurs, rubs, or gallops,no edema  Respiratory:  clear to auscultation bilaterally, normal work of breathing GI: soft, nontender, nondistended, + BS MS: no deformity or atrophy  Skin: warm and dry, no rash Neuro:  Strength and sensation are intact Psych: euthymic mood, full affect   EKG:  EKG is ordered today. The ekg ordered today demonstrates sinus rhythm with sinus  arrhythmia, left anterior fascicular block.   Recent Labs: 02/09/2017: Magnesium 2.4; TSH 0.580 02/15/2017: ALT 18; BUN 28; Creatinine, Ser 1.23; Hemoglobin 13.4; Platelets 169; Potassium 4.3; Sodium 136    Lipid Panel    Component Value Date/Time   CHOL 130 08/01/2012 0036   TRIG 77 08/01/2012 0036   HDL 34 (L) 08/01/2012 0036   CHOLHDL 4.9 12/14/2010 0334   VLDL 15 08/01/2012 0036   LDLCALC 81 08/01/2012 0036      Wt Readings from Last 3 Encounters:  01/11/18 209 lb (94.8 kg)  03/01/17 205 lb (93 kg)  02/21/17 203 lb (92.1 kg)       No flowsheet data found.    ASSESSMENT AND PLAN:  1.  Atrial fibrillation status post RF ablation:  No evidence of recurrent arrhythmia. CHADS VASc score is 1 .   No evidence of recurrent arrhythmia after stopping metoprolol.  2.  Vasovagal syncope in the setting of bradycardia: Bradycardia resolved after stopping metoprolol. No recurrent episodes.   Disposition:   FU with me in 12 months  Signed,  Lorine BearsMuhammad Floy Riegler, MD  01/11/2018 3:37 PM    [facility]

## 2018-01-11 NOTE — Patient Instructions (Signed)
Medication Instructions: No change    Labwork: None.   Procedures/Testing: None.   Follow-Up: 1 year with Dr. Phaedra Colgate.   Any Additional Special Instructions Will Be Listed Below (If Applicable).     If you need a refill on your cardiac medications before your next appointment, please call your pharmacy.   

## 2019-04-03 ENCOUNTER — Telehealth: Payer: Self-pay

## 2019-04-03 NOTE — Telephone Encounter (Signed)
Spoke with patient to schedule overdue F/U with Dr. Fletcher Anon. Patient declined stating he is doing well and does not see the need for F/U at this time. He will call our office if he has any problems or concerns. Patient does not get any medications prescribed from this office and is aware that if he is not seen within a three year period he will be considered a new patient. Recall deleted.

## 2019-11-22 ENCOUNTER — Ambulatory Visit: Payer: Medicare HMO | Attending: Internal Medicine

## 2019-11-22 DIAGNOSIS — Z23 Encounter for immunization: Secondary | ICD-10-CM

## 2019-11-22 NOTE — Progress Notes (Signed)
   Covid-19 Vaccination Clinic  Name:  Billy Carr    MRN: [id number] DOB: 1946/01/22  11/22/2019  Mr. Glazebrook was observed post Covid-19 immunization for 15 minutes without incidence. He was provided with Vaccine Information Sheet and instruction to access the V-Safe system.   Mr. Detloff was instructed to call 911 with any severe reactions post vaccine: Marland Kitchen Difficulty breathing  . Swelling of your face and throat  . A fast heartbeat  . A bad rash all over your body  . Dizziness and weakness    Immunizations Administered    Name Date Dose VIS Date Route   Moderna COVID-19 Vaccine 11/22/2019  2:40 PM 0.5 mL 09/17/2019 Intramuscular   Manufacturer: Moderna   Lot: [id number]   NDC: 59458-592-92

## 2019-12-24 ENCOUNTER — Ambulatory Visit: Payer: Medicare HMO | Attending: Internal Medicine

## 2019-12-24 DIAGNOSIS — Z23 Encounter for immunization: Secondary | ICD-10-CM

## 2019-12-24 NOTE — Progress Notes (Signed)
   Covid-19 Vaccination Clinic  Name:  RYON LAYTON    MRN: [id number] DOB: October 28, 1945  12/24/2019  Mr. Burkhead was observed post Covid-19 immunization for 15 minutes without incident. He was provided with Vaccine Information Sheet and instruction to access the V-Safe system.   Mr. Cowans was instructed to call 911 with any severe reactions post vaccine: Marland Kitchen Difficulty breathing  . Swelling of face and throat  . A fast heartbeat  . A bad rash all over body  . Dizziness and weakness   Immunizations Administered    Name Date Dose VIS Date Route   Moderna COVID-19 Vaccine 12/24/2019  2:12 PM 0.5 mL 09/17/2019 Intramuscular   Manufacturer: Moderna   Lot: [id number]   NDC: 91675-612-54

## 2020-11-16 DIAGNOSIS — I700 Atherosclerosis of aorta: Secondary | ICD-10-CM | POA: Insufficient documentation

## 2020-11-19 ENCOUNTER — Ambulatory Visit (INDEPENDENT_AMBULATORY_CARE_PROVIDER_SITE_OTHER): Payer: Medicare HMO

## 2020-11-19 ENCOUNTER — Ambulatory Visit: Payer: Medicare HMO | Admitting: Cardiovascular Disease

## 2020-11-19 ENCOUNTER — Other Ambulatory Visit: Payer: Self-pay

## 2020-11-19 ENCOUNTER — Encounter: Payer: Self-pay | Admitting: Cardiovascular Disease

## 2020-11-19 VITALS — BP 128/76 | HR 71 | Ht 72.0 in | Wt 193.0 lb

## 2020-11-19 DIAGNOSIS — I480 Paroxysmal atrial fibrillation: Secondary | ICD-10-CM

## 2020-11-19 DIAGNOSIS — R55 Syncope and collapse: Secondary | ICD-10-CM | POA: Diagnosis not present

## 2020-11-19 NOTE — Progress Notes (Signed)
Cardiology Office Note   Date:  11/19/2020   ID:  Billy Carr, DOB January 30, 1946, MRN [id number]  PCP:  Marisue Ivan, MD  Cardiologist:   Lorine Bears, MD   Chief Complaint  Patient presents with  . Follow-up    pt states 3-4 weeks ago he noticed something felt wrong. He went for annual wellness check with Dr. Burnadette Pop and EKG was abnormal---PACs with Bigeminy      History of Present Illness: Billy Carr is a 75 y.o. male who presents for a follow-up visit regarding persistent atrial fibrillation status post ablation in 2015 and 2016 at [facility]. He had a treadmill nuclear stress test in August 2017 which showed no evidence of ischemia with normal ejection fraction.  In 2018, he had an episode of syncope while in the recovery area after lithotripsy that was felt to be vasovagal.  Also he was bradycardic on metoprolol which was discontinued at that time. Echocardiogram showed normal LV systolic function, mild mitral and tricuspid regurgitation and mild pulmonary hypertension.   He reports recent palpitations but different from prior atrial fibrillation.  He feels skipping in his health with no chest pain, shortness of breath or dizziness.  He went to see his primary care physician recently and was noted to have PACs in the form of bigeminy.  Past Medical History:  Diagnosis Date  . Atrial flutter/fib 10/27/2011   flecainide::CHADSVASc--1>> no anticoag 2/14  . Atrial tachycardia (HCC)   . 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 History of shingles   . Nephrolithiasis   . PVC (premature ventricular contraction)    asymptomatic  . Syncope    Neurally mediated-presumed::also ?assoc with AFib  . Vertical nystagmus    no recent issues  . Wears dentures    full upper, partial lower    Past Surgical History:  Procedure Laterality Date  . CARDIAC ELECTROPHYSIOLOGY STUDY AND ABLATION  2015, 2016  . COLONOSCOPY WITH PROPOFOL N/A 10/03/2016   Procedure: COLONOSCOPY  WITH PROPOFOL;  Surgeon: Midge Minium, MD;  Location: [facility];  Service: Endoscopy;  Laterality: N/A;  . EXTRACORPOREAL SHOCK WAVE LITHOTRIPSY Left 02/09/2017   Procedure: EXTRACORPOREAL SHOCK WAVE LITHOTRIPSY (ESWL);  Surgeon: Vanna Scotland, MD;  Location: [facility];  Service: Urology;  Laterality: Left;  . HERNIA REPAIR     bilateral  . NEPHROSTOMY     open, left kidney stone  . SQUAMOUS CELL CARCINOMA EXCISION     Head     Current Outpatient Medications  Medication Sig Dispense Refill  . Multiple Vitamin (MULTIVITAMIN) tablet Take 1 tablet by mouth daily.    . vitamin C (ASCORBIC ACID) 500 MG tablet Take 500 mg by mouth daily.    Marland Kitchen VITAMIN D PO Take by mouth daily.     No current facility-administered medications for this visit.    Allergies:   Patient has no known allergies.    Social History:  The patient  reports that he has never smoked. He has never used smokeless tobacco. He reports that he does not drink alcohol and does not use drugs.'s   Family History:  The patient's family history includes Heart failure in his mother.    ROS:  Please see the history of present illness.   Otherwise, review of systems are positive for none.   All other systems are reviewed and negative.    PHYSICAL EXAM: VS:  BP 128/76   Pulse 71   Ht 6' (1.829 m)  Wt 193 lb (87.5 kg)   BMI 26.18 kg/m  , BMI Body mass index is 26.18 kg/m. GEN: Well nourished, well developed, in no acute distress  HEENT: normal  Neck: no JVD, carotid bruits, or masses Cardiac: RRR with premature beats ; no murmurs, rubs, or gallops,no edema  Respiratory:  clear to auscultation bilaterally, normal work of breathing GI: soft, nontender, nondistended, + BS MS: no deformity or atrophy  Skin: warm and dry, no rash Neuro:  Strength and sensation are intact Psych: euthymic mood, full affect   EKG:  EKG is ordered today. The ekg ordered today demonstrates normal sinus rhythm with PACs and left  anterior fascicular block.   Recent Labs: No results found for requested labs within last 8760 hours.    Lipid Panel    Component Value Date/Time   CHOL 130 08/01/2012 0036   TRIG 77 08/01/2012 0036   HDL 34 (L) 08/01/2012 0036   CHOLHDL 4.9 12/14/2010 0334   VLDL 15 08/01/2012 0036   LDLCALC 81 08/01/2012 0036      Wt Readings from Last 3 Encounters:  11/19/20 193 lb (87.5 kg)  01/11/18 209 lb (94.8 kg)  03/01/17 205 lb (93 kg)       No flowsheet data found.    ASSESSMENT AND PLAN:  1.  Atrial fibrillation status post ablation: He now reports recurrent palpitations and is noted to have frequent PACs.  He feels that the symptoms are different from prior atrial fibrillation but I do think we have to exclude the possibility of recurrent atrial fibrillation given that his [place] vas score now is 2.  I requested a 2-week ZIO monitor. I asked him to discontinue aspirin for now given lack of benefit even if he has underlying atrial fibrillation.  2.  Vasovagal syncope in the setting of bradycardia: No issue since stopping metoprolol.   Disposition:   FU with me in 12 months  Signed,  Lorine Bears, MD  11/19/2020 10:43 AM    [facility]

## 2020-11-19 NOTE — Patient Instructions (Signed)
Medication Instructions:  Your physician has recommended you make the following change in your medication:   STOP Aspirin.   *If you need a refill on your cardiac medications before your next appointment, please call your pharmacy*   Lab Work: None ordered If you have labs (blood work) drawn today and your tests are completely normal, you will receive your results only by: Marland Kitchen MyChart Message (if you have MyChart) OR . A paper copy in the mail If you have any lab test that is abnormal or we need to change your treatment, we will call you to review the results.   Testing/Procedures: Your physician has recommended that you wear a Zio monitor. (To be worn for 14 days) This monitor is a medical device that records the heart's electrical activity. Doctors most often use these monitors to diagnose arrhythmias. Arrhythmias are problems with the speed or rhythm of the heartbeat. The monitor is a small device applied to your chest. You can wear one while you do your normal daily activities. While wearing this monitor if you have any symptoms to push the button and record what you felt. Once you have worn this monitor for the period of time provider prescribed (Usually 14 days), you will return the monitor device in the postage paid box. Once it is returned they will download the data collected and provide [place] with a report which the provider will then review and we will call you with those results. Important tips:  1. Avoid showering during the first 24 hours of wearing the monitor. 2. Avoid excessive sweating to help maximize wear time. 3. Do not submerge the device, no hot tubs, and no swimming pools. 4. Keep any lotions or oils away from the patch. 5. After 24 hours you may shower with the patch on. Take brief showers with your back facing the shower head.  6. Do not remove patch once it has been placed because that will interrupt data and decrease adhesive wear time. 7. Push the button when you  have any symptoms and write down what you were feeling. 8. Once you have completed wearing your monitor, remove and place into box which has postage paid and place in your outgoing mailbox.  9. If for some reason you have misplaced your box then call our office and we can provide another box and/or mail it off for you.         Follow-Up: At [facility], you and your health needs are our priority.  As part of our continuing mission to provide you with exceptional heart care, we have created designated Provider Care Teams.  These Care Teams include your primary Cardiologist (physician) and Advanced Practice Providers (APPs -  Physician Assistants and Nurse Practitioners) who all work together to provide you with the care you need, when you need it.  We recommend signing up for the patient portal called "MyChart".  Sign up information is provided on this After Visit Summary.  MyChart is used to connect with patients for Virtual Visits (Telemedicine).  Patients are able to view lab/test results, encounter notes, upcoming appointments, etc.  Non-urgent messages can be sent to your provider as well.   To learn more about what you can do with MyChart, go to [email or web address].    Your next appointment:   Your physician wants you to follow-up in: 1 year You will receive a reminder letter in the mail two months in advance. If you don't receive a letter, please call our office to schedule  the follow-up appointment.   The format for your next appointment:   In Person  Provider:   You may see Lorine Bears, MD or one of the following Advanced Practice Providers on your designated Care Team:    Nicolasa Ducking, NP  Eula Listen, PA-C  Marisue Ivan, PA-C  Cadence [place], [place]  Gillian Shields, NP    Other Instructions N/A

## 2020-12-03 DIAGNOSIS — I480 Paroxysmal atrial fibrillation: Secondary | ICD-10-CM | POA: Diagnosis not present

## 2020-12-18 ENCOUNTER — Telehealth: Payer: Self-pay

## 2020-12-18 DIAGNOSIS — I472 Ventricular tachycardia, unspecified: Secondary | ICD-10-CM

## 2020-12-18 DIAGNOSIS — I471 Supraventricular tachycardia: Secondary | ICD-10-CM

## 2020-12-18 NOTE — Telephone Encounter (Signed)
Patient made aware of cardiac monitor results and Dr. Jari Sportsman recommendation. Patient is agreeable with the plan.  Order placed for echo, stress myoview, and ref to EP. Patient rqst Dr. Graciela Husbands since he has seen him in the past.  Pt given verbal pre-stress test instructions   [facility] MYOVIEW  Your caregiver has ordered a Stress Test with nuclear imaging. The purpose of this test is to evaluate the blood supply to your heart muscle. This procedure is referred to as a "Non-Invasive Stress Test." This is because other than having an IV started in your vein, nothing is inserted or "invades" your body. Cardiac stress tests are done to find areas of poor blood flow to the heart by determining the extent of coronary artery disease (CAD). Some patients exercise on a treadmill, which naturally increases the blood flow to your heart, while others who are  unable to walk on a treadmill due to physical limitations have a pharmacologic/chemical stress agent called Lexiscan . This medicine will mimic walking on a treadmill by temporarily increasing your coronary blood flow.   Please note: these test may take anywhere between 2-4 hours to complete  PLEASE REPORT TO [facility] MEDICAL MALL ENTRANCE  THE VOLUNTEERS AT THE FIRST DESK WILL DIRECT YOU WHERE TO GO  Date of Procedure:_____________________________________  Arrival Time for Procedure:______________________________  Instructions regarding medication:    PLEASE NOTIFY THE OFFICE AT LEAST 24 HOURS IN ADVANCE IF YOU ARE UNABLE TO KEEP YOUR APPOINTMENT.  [phone] AND  PLEASE NOTIFY NUCLEAR MEDICINE AT [facility] AT LEAST 24 HOURS IN ADVANCE IF YOU ARE UNABLE TO KEEP YOUR APPOINTMENT. [phone]  How to prepare for your Myoview test:  1. Do not eat or drink after midnight 2. No caffeine for 24 hours prior to test 3. No smoking 24 hours prior to test. 4. Your medication may be taken with water.  If your doctor stopped a medication because of this test, do not  take that medication.  .  Please wear a short sleeve shirt. 5. No cologne or lotion. 6. Wear comfortable walking shoes. No heels!   COVID PRE- TEST: You will need a COVID TEST prior to the procedure:  LOCATION: [facility]-Thru Testing site.  DATE/TIME:   (8:00 am- 10:00 am)

## 2020-12-18 NOTE — Telephone Encounter (Signed)
-----   Message from Iran Ouch, MD sent at 12/18/2020 10:29 AM EST ----- Inform patient that monitor showed intermittent episodes of ventricular tachycardia.  These are different from A. fib or SVT as they come from the lower part of the heart and we occasionally can be more serious.  In addition, he had multiple short runs of SVT. Given these findings especially the ventricular tachycardia, I recommend an echocardiogram as well as a treadmill nuclear stress test.  I want him to be seen by EP after his cardiac testing.  Thanks

## 2020-12-29 ENCOUNTER — Ambulatory Visit (INDEPENDENT_AMBULATORY_CARE_PROVIDER_SITE_OTHER): Payer: Medicare HMO

## 2020-12-29 ENCOUNTER — Other Ambulatory Visit: Payer: Self-pay

## 2020-12-29 DIAGNOSIS — I471 Supraventricular tachycardia: Secondary | ICD-10-CM

## 2020-12-29 DIAGNOSIS — I472 Ventricular tachycardia, unspecified: Secondary | ICD-10-CM

## 2020-12-29 LAB — ECHOCARDIOGRAM COMPLETE
Area-P 1/2: 4.12 cm2
MV M vel: 4.67 m/s
MV Peak grad: 87.2 mmHg
S' Lateral: 2.6 cm

## 2021-01-01 ENCOUNTER — Other Ambulatory Visit: Payer: Self-pay

## 2021-01-01 ENCOUNTER — Other Ambulatory Visit
Admission: RE | Admit: 2021-01-01 | Discharge: 2021-01-01 | Disposition: A | Discharge status: Home / Self Care | Payer: Medicare HMO | Source: Ambulatory Visit | Attending: Cardiovascular Disease | Admitting: Cardiovascular Disease

## 2021-01-01 DIAGNOSIS — Z20822 Contact with and (suspected) exposure to covid-19: Secondary | ICD-10-CM | POA: Diagnosis not present

## 2021-01-01 DIAGNOSIS — Z01812 Encounter for preprocedural laboratory examination: Secondary | ICD-10-CM | POA: Insufficient documentation

## 2021-01-01 LAB — SARS CORONAVIRUS 2 (TAT 6-24 HRS): SARS Coronavirus 2: NEGATIVE

## 2021-01-04 ENCOUNTER — Other Ambulatory Visit: Payer: Self-pay

## 2021-01-04 ENCOUNTER — Encounter
Admission: RE | Admit: 2021-01-04 | Discharge: 2021-01-04 | Disposition: A | Discharge status: Home / Self Care | Payer: Medicare HMO | Source: Ambulatory Visit | Attending: Cardiovascular Disease | Admitting: Cardiovascular Disease

## 2021-01-04 DIAGNOSIS — I471 Supraventricular tachycardia: Secondary | ICD-10-CM | POA: Diagnosis present

## 2021-01-04 DIAGNOSIS — I472 Ventricular tachycardia, unspecified: Secondary | ICD-10-CM

## 2021-01-04 LAB — NM MYOCAR MULTI W/SPECT W/WALL MOTION / EF
Estimated workload: 10.1 METS
Exercise duration (min): 9 min
Exercise duration (sec): 30 s
LV dias vol: 92 mL (ref 62–150)
LV sys vol: 33 mL
MPHR: 145 {beats}/min
Peak HR: 125 {beats}/min
Percent HR: 86 %
Rest HR: 66 {beats}/min
SDS: 1
SRS: 1
SSS: 0
TID: 0.95

## 2021-01-04 MED ORDER — TECHNETIUM TC 99M TETROFOSMIN IV KIT
30.0000 | PACK | Freq: Once | INTRAVENOUS | Status: AC | PRN
  Administered 2021-01-04: 31.097 via INTRAVENOUS

## 2021-01-04 MED ORDER — TECHNETIUM TC 99M TETROFOSMIN IV KIT
10.0000 | PACK | Freq: Once | INTRAVENOUS | Status: AC | PRN
  Administered 2021-01-04: 9.532 via INTRAVENOUS

## 2021-01-27 NOTE — Progress Notes (Signed)
ELECTROPHYSIOLOGY CONSULT NOTE  Patient ID: Billy Carr, MRN: [id number], DOB/AGE: 1946-01-02 75 y.o. Admit date: (Not on file) Date of Consult: 01/28/2021  Primary Physician: Marisue Ivan, MD Primary Cardiologist:MA     Billy Carr is a 75 y.o. male who is being seen today for the evaluation of abnormal event recorder at the request of MA.    HPI Billy Carr is a 75 y.o. male whom I saw about a decade ago and most recently in 2014.  Since then he has had Hx of AFib ablation x2 most recently  combination Cryo/RF [facility] 1/15   Noted by his PCP to have an abnormal auscultation, ECG demonstrated  bigeminy, presumably atrial by description and seen in by Dr. Marcheta Grammes with complaints of a new palpitation syndrome However when I talk with him he denies SOB, chest pain edema or palpitations.  There has been no recent syncope or presyncope.  Remote history of syncope when I had seen him previously.  Again during lithotripsy thought to be vasovagal.  DATE TEST EF   8/17 MYOVIEW    * % No ischemia  3/22 Echo  60-65% LA size normal   3/22 Myoview 55-65% No Ischemia    Date Cr K Hgb  1/22 1.0 4.2 13.8         Event Recorder personnally reviewed   Recurrent nonsustained Atach  And WCT which has criteria that suggest both VT and SVT aberration   Past Medical History:  Diagnosis Date  . Atrial flutter/fib 10/27/2011   flecainide::CHADSVASc--1>> no anticoag 2/14  . Atrial tachycardia (HCC)   . 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 History of shingles   . Nephrolithiasis   . PVC (premature ventricular contraction)    asymptomatic  . Syncope    Neurally mediated-presumed::also ?assoc with AFib  . Vertical nystagmus    no recent issues  . Wears dentures    full upper, partial lower      Surgical History:  Past Surgical History:  Procedure Laterality Date  . CARDIAC ELECTROPHYSIOLOGY STUDY AND ABLATION  2015, 2016  . COLONOSCOPY WITH PROPOFOL N/A 10/03/2016    Procedure: COLONOSCOPY WITH PROPOFOL;  Surgeon: Midge Minium, MD;  Location: [facility];  Service: Endoscopy;  Laterality: N/A;  . EXTRACORPOREAL SHOCK WAVE LITHOTRIPSY Left 02/09/2017   Procedure: EXTRACORPOREAL SHOCK WAVE LITHOTRIPSY (ESWL);  Surgeon: Vanna Scotland, MD;  Location: [facility];  Service: Urology;  Laterality: Left;  . HERNIA REPAIR     bilateral  . NEPHROSTOMY     open, left kidney stone  . SQUAMOUS CELL CARCINOMA EXCISION     Head     Home Meds: Current Meds  Medication Sig  . Multiple Vitamin (MULTIVITAMIN) tablet Take 1 tablet by mouth daily.  . vitamin C (ASCORBIC ACID) 500 MG tablet Take 500 mg by mouth daily.  Marland Kitchen VITAMIN D PO Take by mouth daily.    Allergies: No Known Allergies  Social History   Socioeconomic History  . Marital status: Married    Spouse name: Not on file  . Number of children: Not on file  . Years of education: Not on file  . Highest education level: Not on file  Occupational History  . Occupation: Optician, dispensing  Tobacco Use  . Smoking status: Never Smoker  . Smokeless tobacco: Never Used  Vaping Use  . Vaping Use: Never used  Substance and Sexual Activity  . Alcohol use: No  . Drug use:  No  . Sexual activity: Not on file  Other Topics Concern  . Not on file  Social History Narrative  . Not on file   Social Determinants of Health   Financial Resource Strain: Not on file  Food Insecurity: Not on file  Transportation Needs: Not on file  Physical Activity: Not on file  Stress: Not on file  Social Connections: Not on file  Intimate Partner Violence: Not on file     Family History  Problem Relation Age of Onset  . Heart failure Mother      ROS:  Please see the history of present illness.     All other systems reviewed and negative.    Physical Exam:  Blood pressure 114/64, pulse 64, height 6' (1.829 m), weight 189 lb (85.7 kg). General: Well developed, well nourished male in no acute distress. Head:  Normocephalic, atraumatic, sclera non-icteric, no xanthomas, nares are without discharge. EENT: normal  Lymph Nodes:  none Neck: Negative for carotid bruits. JVD not elevated. Back:without scoliosis kyphosis  Lungs: Clear bilaterally to auscultation without wheezes, rales, or rhonchi. Breathing is unlabored. Heart: Irregular RR with S1 S2. No  murmur . No rubs, or gallops appreciated. Abdomen: Soft, non-tender, non-distended with normoactive bowel sounds. No hepatomegaly. No rebound/guarding. No obvious abdominal masses. Msk:  Strength and tone appear normal for age. Extremities: No clubbing or cyanosis. No  edema.  Distal pedal pulses are 2+ and equal bilaterally. Skin: Warm and Dry Neuro: Alert and oriented X 3. CN III-XII intact Grossly normal sensory and motor function . Psych:  Responds to questions appropriately with a normal affect.      Labs: Cardiac Enzymes No results for input(s): CKTOTAL, CKMB, TROPONINI in the last 72 hours. CBC Lab Results  Component Value Date   WBC 12.9 (H) 02/15/2017   HGB 13.4 02/15/2017   HCT 40.9 02/15/2017   MCV 90.4 02/15/2017   PLT 169 02/15/2017   PROTIME: No results for input(s): LABPROT, INR in the last 72 hours. Chemistry No results for input(s): NA, K, CL, CO2, BUN, CREATININE, CALCIUM, PROT, BILITOT, ALKPHOS, ALT, AST, GLUCOSE in the last 168 hours.  Invalid input(s): LABALBU Lipids Lab Results  Component Value Date   CHOL 130 08/01/2012   HDL 34 (L) 08/01/2012   LDLCALC 81 08/01/2012   TRIG 77 08/01/2012   BNP No results found for: PROBNP Thyroid Function Tests: No results for input(s): TSH, T4TOTAL, T3FREE, THYROIDAB in the last 72 hours.  Invalid input(s): FREET3 Miscellaneous No results found for: DDIMER  Radiology/Studies:  NM Myocar Multi W/Spect W/Wall Motion / EF  Result Date: 01/04/2021  Blood pressure demonstrated a normal response to exercise.  There was no ST segment deviation noted during stress.  Excellent exercise capacity with exercise duration of 9 minutes and 30 seconds.  T wave inversion was noted during stress in the aVR and aVL leads. T wave inversion persisted. Persistent throughout study  The study is normal.  This is a low risk study.  The left ventricular ejection fraction is normal (55-65%).  CT attenuation images show mild coronary and aortic calcifications.    ECHOCARDIOGRAM COMPLETE  Result Date: 12/29/2020    ECHOCARDIOGRAM REPORT   Patient Name:   Vladimir FasterWALTER Zweber Date of Exam: 12/29/2020 Medical Rec #:  [id number]      Height:       72.0 in Accession #:    [id number]     Weight:       193.0 lb Date of Birth:  [birth year]       BSA:          2.099 m Patient Age:    74 years       BP:           128/76 mmHg Patient Gender: M              HR:           84 bpm. Exam Location:  [place] Procedure: 2D Echo, 3D Echo, Cardiac Doppler and Color Doppler Indications:    I47.1 SVT; I47.2 Ventricular tachycardia  History:        Patient has prior history of Echocardiogram examinations, most                 recent 02/09/2017. Arrythmias:Atrial Fibrillation and VT/SVT. 2                 Ablations.  Sonographer:    Joaquim Nam Referring Phys: 45 MUHAMMAD A ARIDA IMPRESSIONS  1. Left ventricular ejection fraction, by estimation, is 60 to 65%. The left ventricle has normal function. The left ventricle has no regional wall motion abnormalities. There is mild asymmetric left ventricular hypertrophy of the septal segment. Left ventricular diastolic parameters are indeterminate.  2. Right ventricular systolic function is normal. The right ventricular size is normal. There is mildly elevated pulmonary artery systolic pressure. The estimated right ventricular systolic pressure is 41.7 mmHg.  3. The mitral valve is normal in structure. Mild to moderate mitral valve regurgitation. No evidence of mitral stenosis.  4. The aortic valve has an indeterminant number of cusps. Aortic valve regurgitation is not  visualized. Mild to moderate aortic valve sclerosis/calcification is present, without any evidence of aortic stenosis.  5. The inferior vena cava is normal in size with greater than 50% respiratory variability, suggesting right atrial pressure of 3 mmHg. FINDINGS  Left Ventricle: Left ventricular ejection fraction, by estimation, is 60 to 65%. The left ventricle has normal function. The left ventricle has no regional wall motion abnormalities. The left ventricular internal cavity size was normal in size. There is  mild asymmetric left ventricular hypertrophy of the septal segment. Left ventricular diastolic parameters are indeterminate. Right Ventricle: The right ventricular size is normal. No increase in right ventricular wall thickness. Right ventricular systolic function is normal. There is mildly elevated pulmonary artery systolic pressure. The tricuspid regurgitant velocity is 3.11  m/s, and with an assumed right atrial pressure of 3 mmHg, the estimated right ventricular systolic pressure is 41.7 mmHg. Left Atrium: Left atrial size was normal in size. Right Atrium: Right atrial size was normal in size. Pericardium: There is no evidence of pericardial effusion. Mitral Valve: The mitral valve is normal in structure. Mild mitral annular calcification. Mild to moderate mitral valve regurgitation. No evidence of mitral valve stenosis. Tricuspid Valve: The tricuspid valve is normal in structure. Tricuspid valve regurgitation is mild . No evidence of tricuspid stenosis. Aortic Valve: The aortic valve has an indeterminant number of cusps. Aortic valve regurgitation is not visualized. Mild to moderate aortic valve sclerosis/calcification is present, without any evidence of aortic stenosis. Pulmonic Valve: The pulmonic valve was normal in structure. Pulmonic valve regurgitation is not visualized. No evidence of pulmonic stenosis. Aorta: The aortic root is normal in size and structure. Venous: The inferior vena cava is  normal in size with greater than 50% respiratory variability, suggesting right atrial pressure of 3 mmHg. IAS/Shunts: No atrial level shunt detected by color flow Doppler.  LEFT VENTRICLE PLAX  2D LVIDd:         4.10 cm LVIDs:         2.60 cm LV PW:         0.80 cm LV IVS:        1.40 cm LVOT diam:     2.10 cm  3D Volume EF: LV SV:         79       3D EF:        60 % LV SV Index:   38       LV EDV:       140 ml LVOT Area:     3.46 cm LV ESV:       57 ml                         LV SV:        83 ml RIGHT VENTRICLE TAPSE (M-mode): 2.3 cm LEFT ATRIUM             Index       RIGHT ATRIUM           Index LA diam:        3.00 cm 1.43 cm/m  RA Area:     13.70 cm LA Vol (A2C):   28.6 ml 13.63 ml/m RA Volume:   33.90 ml  16.15 ml/m LA Vol (A4C):   30.3 ml 14.44 ml/m LA Biplane Vol: 31.2 ml 14.87 ml/m  AORTIC VALVE LVOT Vmax:   108.00 cm/s LVOT Vmean:  67.400 cm/s LVOT VTI:    0.229 m  AORTA Ao Root diam: 3.30 cm Ao Asc diam:  3.70 cm MITRAL VALVE                TRICUSPID VALVE MV Area (PHT): 4.12 cm     TR Peak grad:   38.7 mmHg MV Decel Time: 184 msec     TR Vmax:        311.00 cm/s MR Peak grad: 87.2 mmHg MR Vmax:      467.00 cm/s   SHUNTS MV E velocity: 157.00 cm/s  Systemic VTI:  0.23 m MV A velocity: 110.00 cm/s  Systemic Diam: 2.10 cm MV E/A ratio:  1.43 Lorine Bears MD Electronically signed by Lorine Bears MD Signature Date/Time: 12/29/2020/5:15:12 PM    Final     EKG: Sinus at 64 with atrial bigeminy Interval of 18/09/39 Axis left -72   Assessment and Plan:  Atrial bigeminy  Atrial fibrillation with a history of prior ablation  Atrial tachycardia-nonsustained  Ventricular tachycardia-nonsustained   The patient had atrial ectopy following catheter ablation prompting this evaluation.  He has had no attributable symptoms.  The event recorder was surprising and that it demonstrated nonsustained ventricular tachycardia and at one point a flurry of recurrent episodes of nonsustained VT not  typically associated with what his exercise time  The implications would be related to its association with underlying structural heart disease.  So far his Myoview scan has been negative and his echo was normal apart from mild asymmetric septal hypertrophy.  As discussed I would recommend cMRI to further clarify myocardial structure.  I think, in the light of his level of fitness his Myoview scan is probably sufficiently negatively predictive so would not pursue CTA at this time  Sherryl Manges

## 2021-01-28 ENCOUNTER — Other Ambulatory Visit: Payer: Self-pay

## 2021-01-28 ENCOUNTER — Telehealth: Payer: Self-pay | Admitting: Internal Medicine

## 2021-01-28 ENCOUNTER — Ambulatory Visit: Payer: Medicare HMO | Admitting: Internal Medicine

## 2021-01-28 ENCOUNTER — Encounter: Payer: Self-pay | Admitting: Internal Medicine

## 2021-01-28 VITALS — BP 114/64 | HR 64 | Ht 72.0 in | Wt 189.0 lb

## 2021-01-28 DIAGNOSIS — I480 Paroxysmal atrial fibrillation: Secondary | ICD-10-CM

## 2021-01-28 DIAGNOSIS — I471 Supraventricular tachycardia: Secondary | ICD-10-CM

## 2021-01-28 DIAGNOSIS — I472 Ventricular tachycardia, unspecified: Secondary | ICD-10-CM

## 2021-01-28 DIAGNOSIS — Z01812 Encounter for preprocedural laboratory examination: Secondary | ICD-10-CM | POA: Diagnosis not present

## 2021-01-28 NOTE — Patient Instructions (Signed)
Medication Instructions:  - Your physician recommends that you continue on your current medications as directed. Please refer to the Current Medication list given to you today.  *If you need a refill on your cardiac medications before your next appointment, please call your pharmacy*   Lab Work: - Your physician recommends that you have lab work today: BMP  If you have labs (blood work) drawn today and your tests are completely normal, you will receive your results only by: Marland Kitchen MyChart Message (if you have MyChart) OR . A paper copy in the mail If you have any lab test that is abnormal or we need to change your treatment, we will call you to review the results.   Testing/Procedures: - Your physician has requested that you have a cardiac MRI. Cardiac MRI uses a computer to create images of your heart as its beating, producing both still and moving pictures of your heart and major blood vessels.   You are scheduled for Cardiac MRI on _________________   Please arrive at the [facility] main entrance of [facility] at ___________________ (30-45 minutes prior to test start time). ?   [facility]  [address]  [place], [place] [address]  [phone]  Proceed to the [facility] Radiology Department (First Floor).  ?  Magnetic resonance imaging (MRI) is a painless test that produces images of the inside of the body without using X-rays. During an MRI, strong magnets and radio waves work together in a Data processing manager to form detailed images. MRI images may provide more details about a medical condition than X-rays, CT scans, and ultrasounds can provide.   - You may be given earphones to listen for instructions.  - You may eat a light breakfast and take medications as ordered with the exception of _________________ (fluid pill, other). - If a contrast material will be used, an IV will be inserted into one of your veins. - Contrast material will be injected into your IV.   - You will be asked to remove all metal, including: Watch, jewelry, and other metal objects including hearing aids, hair pieces and dentures. (Braces and fillings normally are not a problem.)    - If contrast material was used:  It will leave your body through your urine within a day. You may be told to drink plenty of fluids to help flush the contrast material out of your system.  TEST WILL TAKE APPROXIMATELY 1 HOUR  PLEASE NOTIFY SCHEDULING AT LEAST 24 HOURS IN ADVANCE IF YOU ARE UNABLE TO KEEP YOUR APPOINTMENT.     Follow-Up: At [facility], you and your health needs are our priority.  As part of our continuing mission to provide you with exceptional heart care, we have created designated Provider Care Teams.  These Care Teams include your primary Cardiologist (physician) and Advanced Practice Providers (APPs -  Physician Assistants and Nurse Practitioners) who all work together to provide you with the care you need, when you need it.  We recommend signing up for the patient portal called "MyChart".  Sign up information is provided on this After Visit Summary.  MyChart is used to connect with patients for Virtual Visits (Telemedicine).  Patients are able to view lab/test results, encounter notes, upcoming appointments, etc.  Non-urgent messages can be sent to your provider as well.   To learn more about what you can do with MyChart, go to [email or web address].    Your next appointment:   As needed   The  format for your next appointment:   In Person  Provider:   Sherryl Manges, MD   Other Instructions n/a

## 2021-01-29 LAB — BASIC METABOLIC PANEL
BUN/Creatinine Ratio: 19 (ref 10–24)
BUN: 18 mg/dL (ref 8–27)
CO2: 22 mmol/L (ref 20–29)
Calcium: 9.3 mg/dL (ref 8.6–10.2)
Chloride: 102 mmol/L (ref 96–106)
Creatinine, Ser: 0.94 mg/dL (ref 0.76–1.27)
Sodium: 142 mmol/L (ref 134–144)
eGFR: 85 mL/min/{1.73_m2} (ref >59)

## 2021-02-02 ENCOUNTER — Encounter: Payer: Self-pay | Admitting: Internal Medicine

## 2021-02-02 NOTE — Telephone Encounter (Signed)
Left message for patient regarding the Friday 03/05/21 9:00 am Cardiac MRI appointment at [facility]--arrival time is 8:30 am--1st floor admissions office for check in. Will mail information to patient and requested he call with questions or concerns.

## 2021-03-04 ENCOUNTER — Telehealth (HOSPITAL_COMMUNITY): Payer: Self-pay | Admitting: *Deleted

## 2021-03-04 NOTE — Telephone Encounter (Signed)
Attempted to call patient regarding upcoming cardiac MRI appointment. Left message on voicemail with name and callback number  Nainoa Woldt RN Navigator Cardiac Imaging [facility] [phone] Office [phone] Cell  

## 2021-03-04 NOTE — Telephone Encounter (Signed)
Pt returning call regarding upcoming cardiac imaging study; pt verbalizes understanding of appt date/time, parking situation and where to check in,, and verified current allergies; name and call back number provided for further questions should they arise  Larey Brick RN Navigator Cardiac Imaging [facility] [phone] office [phone] cell

## 2021-03-05 ENCOUNTER — Other Ambulatory Visit: Payer: Self-pay

## 2021-03-05 ENCOUNTER — Ambulatory Visit (HOSPITAL_COMMUNITY)
Admission: RE | Admit: 2021-03-05 | Discharge: 2021-03-05 | Disposition: A | Discharge status: Home / Self Care | Payer: Medicare HMO | Source: Ambulatory Visit | Attending: Internal Medicine | Admitting: Internal Medicine

## 2021-03-05 DIAGNOSIS — I472 Ventricular tachycardia, unspecified: Secondary | ICD-10-CM

## 2021-03-05 MED ORDER — GADOBUTROL 1 MMOL/ML IV SOLN
10.0000 mL | Freq: Once | INTRAVENOUS | Status: AC | PRN
  Administered 2021-03-05: 10 mL via INTRAVENOUS

## 2021-03-31 ENCOUNTER — Telehealth: Payer: Self-pay | Admitting: Internal Medicine

## 2021-03-31 NOTE — Telephone Encounter (Signed)
I spoke with the patient regarding his Cardiac MRI results. The patient voices understanding of these results.  Per the patient, he has noticed a decrease in how erratic his heart rhythm was feeling, feels it is "back to normal."  He did have a question that he forgot to mention to Dr. Graciela Husbands when he was here for his office visit.  He advised that he had taken all 3 COVID vaccines. Within 30 days of his booster he started to feel that his heart rhythm was more erratic. He was wanting to know if Dr. Graciela Husbands might feel this was related to the COVID booster. I advised the patient I could not say for sure as I feel we are constantly learning the world of COVID and vaccines, but I will be glad to forward his message to Dr. Graciela Husbands to review and call him back with his response.  The patient was very appreciative and agreeable.

## 2021-03-31 NOTE — Telephone Encounter (Signed)
Duke Salvia, MD [phone])  Sent: Wed March 31, 2021 11:27 AM  To: Jefferey Pica, RN; Alois Cliche, RN          Result Note  Please Inform Patient that study was normal    Thanks

## 2021-04-01 NOTE — Telephone Encounter (Signed)
I really do not know the answer to that.  There are some data that in response to the vaccines there can be some cardiac injury, this is particularly true young males.  (You dont come  qualify) but it is certainly within the realm of possibility.  Mostly the symptoms have been self-limited which is consistent with your symptoms getting better.  Let us hope they stay away.

## 2021-04-02 NOTE — Telephone Encounter (Signed)
I called and spoke with the patient. I have advised him of Dr. Odessa Fleming response as stated below.  The patient voices understanding and was appreciative for the call back.

## 2022-11-21 DIAGNOSIS — Z862 Personal history of diseases of the blood and blood-forming organs and certain disorders involving the immune mechanism: Secondary | ICD-10-CM | POA: Insufficient documentation

## 2023-10-31 ENCOUNTER — Ambulatory Visit: Payer: HMO | Attending: Cardiovascular Disease | Admitting: Cardiovascular Disease

## 2023-10-31 ENCOUNTER — Encounter: Payer: Self-pay | Admitting: Cardiovascular Disease

## 2023-10-31 VITALS — BP 116/68 | HR 66 | Ht 72.0 in | Wt 201.5 lb

## 2023-10-31 DIAGNOSIS — Z8679 Personal history of other diseases of the circulatory system: Secondary | ICD-10-CM | POA: Diagnosis not present

## 2023-10-31 DIAGNOSIS — I493 Ventricular premature depolarization: Secondary | ICD-10-CM

## 2023-10-31 DIAGNOSIS — Z9889 Other specified postprocedural states: Secondary | ICD-10-CM

## 2023-10-31 NOTE — Progress Notes (Signed)
 Cardiology Office Note   Date:  10/31/2023   ID:  Billy Carr, DOB [birth year], MRN [id number]  PCP:  Alla Amis, MD  Cardiologist:   Deatrice Cage, MD   Chief Complaint  Patient presents with   Follow-up    12 Month f/u no complaints today. Meds reviewed verbally with pt.      History of Present Illness: Billy Carr is a 78 y.o. male who presents for a follow-up visit regarding persistent atrial fibrillation status post ablation in 2015 and 2016 at [facility]. He had a treadmill nuclear stress test in August 2017 which showed no evidence of ischemia with normal ejection fraction.  In 2018, he had an episode of syncope while in the recovery area after lithotripsy that was felt to be vasovagal.  Also he was bradycardic on metoprolol  which was discontinued at that time. Echocardiogram showed normal LV systolic function, mild mitral and tricuspid regurgitation and mild pulmonary hypertension.   Please palpitations in 2022 in the setting of taking the COVID-19 booster.  He had an outpatient monitor done which showed 7 runs of ventricular tachycardia and frequent short runs of SVT.  An echocardiogram was done then and showed normal LV systolic function with mild to moderate mitral regurgitation.  Treadmill nuclear stress test showed no evidence of ischemia.  He was able to exercise for 9 minutes and 30 seconds.  He was seen by Dr. Fernande who recommended a cardiac MRI which came back normal.  No intervention was needed.  He has been doing very well since then with no chest pain, shortness of breath or palpitations.  He continues to be active and exercises on a regular basis with no exertional symptoms.  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   History of shingles    Nephrolithiasis    PVC (premature ventricular contraction)     asymptomatic   Syncope    Neurally mediated-presumed::also ?assoc with AFib   Vertical nystagmus    no recent issues   Wears dentures    full upper, partial lower    Past Surgical History:  Procedure Laterality Date   CARDIAC ELECTROPHYSIOLOGY STUDY AND ABLATION  2015, 2016   COLONOSCOPY WITH PROPOFOL  N/A 10/03/2016   Procedure: COLONOSCOPY WITH PROPOFOL ;  Surgeon: Rogelia Copping, MD;  Location: [facility];  Service: Endoscopy;  Laterality: N/A;   EXTRACORPOREAL SHOCK WAVE LITHOTRIPSY Left 02/09/2017   Procedure: EXTRACORPOREAL SHOCK WAVE LITHOTRIPSY (ESWL);  Surgeon: Rosina Riis, MD;  Location: [facility];  Service: Urology;  Laterality: Left;   HERNIA REPAIR     bilateral   NEPHROSTOMY     open, left kidney stone   SQUAMOUS CELL CARCINOMA EXCISION     Head     Current Outpatient Medications  Medication Sig Dispense Refill   Multiple Vitamin (MULTIVITAMIN) tablet Take 1 tablet by mouth daily.     Multiple Vitamins-Minerals (ZINC PO) Take by mouth daily.     vitamin C  (ASCORBIC ACID ) 500 MG tablet Take 500 mg by mouth daily.     VITAMIN D PO Take by mouth daily.     No current facility-administered medications for this visit.    Allergies:   Patient has no known allergies.    Social History:  The patient  reports that he has never smoked. He has never used smokeless tobacco. He  reports that he does not drink alcohol and does not use drugs.'s   Family History:  The patient's family history includes Heart failure in his mother.    ROS:  Please see the history of present illness.   Otherwise, review of systems are positive for none.   All other systems are reviewed and negative.    PHYSICAL EXAM: VS:  BP 116/68 (BP Location: Left Arm, Patient Position: Sitting, Cuff Size: Normal)   Pulse 66   Ht 6' (1.829 m)   Wt 201 lb 8 oz (91.4 kg)   SpO2 99%   BMI 27.33 kg/m  , BMI Body mass index is 27.33 kg/m. GEN: Well nourished, well developed, in no acute distress   HEENT: normal  Neck: no JVD, carotid bruits, or masses Cardiac: RRR with premature beats ; no murmurs, rubs, or gallops,no edema  Respiratory:  clear to auscultation bilaterally, normal work of breathing GI: soft, nontender, nondistended, + BS MS: no deformity or atrophy  Skin: warm and dry, no rash Neuro:  Strength and sensation are intact Psych: euthymic mood, full affect   EKG:  EKG is ordered today. The ekg ordered today demonstrates : Normal sinus rhythm with sinus arrhythmia Left anterior fascicular block When compared with ECG of 09-Feb-2017 10:36, ST no longer depressed in Inferior leads T wave inversion no longer evident in Inferior leads    Recent Labs: No results found for requested labs within last 365 days.    Lipid Panel    Component Value Date/Time   CHOL 130 08/01/2012 0036   TRIG 77 08/01/2012 0036   HDL 34 (L) 08/01/2012 0036   CHOLHDL 4.9 12/14/2010 0334   VLDL 15 08/01/2012 0036   LDLCALC 81 08/01/2012 0036      Wt Readings from Last 3 Encounters:  10/31/23 201 lb 8 oz (91.4 kg)  01/28/21 189 lb (85.7 kg)  11/19/20 193 lb (87.5 kg)           No data to display            ASSESSMENT AND PLAN:  1.  Atrial fibrillation status post ablation: He had recurrent atrial arrhythmia and nonsustained ventricular tachycardia in 2022 but with negative workup.  No recurrent symptoms since then and has been doing very well .  2.  Vasovagal syncope in the setting of bradycardia: No issue since stopping metoprolol .  3.  Transient nonsustained ventricular tachycardia in 2022 after COVID-19 booster.  Negative workup including echo, cardiac MRI and nuclear stress test.  No recurrent symptoms.   Disposition:   FU with me in 12 months  Signed,  Deatrice Cage, MD  10/31/2023 4:23 PM    [facility]

## 2023-10-31 NOTE — Patient Instructions (Signed)
 Medication Instructions:  No changes *If you need a refill on your cardiac medications before your next appointment, please call your pharmacy*   Lab Work: None ordered If you have labs (blood work) drawn today and your tests are completely normal, you will receive your results only by: MyChart Message (if you have MyChart) OR A paper copy in the mail If you have any lab test that is abnormal or we need to change your treatment, we will call you to review the results.   Testing/Procedures: None ordered   Follow-Up: At [facility], you and your health needs are our priority.  As part of our continuing mission to provide you with exceptional heart care, we have created designated Provider Care Teams.  These Care Teams include your primary Cardiologist (physician) and Advanced Practice Providers (APPs -  Physician Assistants and Nurse Practitioners) who all work together to provide you with the care you need, when you need it.  We recommend signing up for the patient portal called "MyChart".  Sign up information is provided on this After Visit Summary.  MyChart is used to connect with patients for Virtual Visits (Telemedicine).  Patients are able to view lab/test results, encounter notes, upcoming appointments, etc.  Non-urgent messages can be sent to your provider as well.   To learn more about what you can do with MyChart, go to [email or web address].    Your next appointment:   12 month(s)  Provider:   You may see Dr. Kirke Corin or one of the following Advanced Practice Providers on your designated Care Team:   Nicolasa Ducking, NP Eula Listen, PA-C Cadence Fransico Michael, PA-C Charlsie Quest, NP Carlos Levering, NP

## 2023-11-17 DIAGNOSIS — R7303 Prediabetes: Secondary | ICD-10-CM | POA: Diagnosis not present

## 2023-11-17 DIAGNOSIS — Z0000 Encounter for general adult medical examination without abnormal findings: Secondary | ICD-10-CM | POA: Diagnosis not present

## 2023-11-17 DIAGNOSIS — Z862 Personal history of diseases of the blood and blood-forming organs and certain disorders involving the immune mechanism: Secondary | ICD-10-CM | POA: Diagnosis not present

## 2023-12-14 DIAGNOSIS — R7303 Prediabetes: Secondary | ICD-10-CM | POA: Diagnosis not present

## 2023-12-14 DIAGNOSIS — Z0000 Encounter for general adult medical examination without abnormal findings: Secondary | ICD-10-CM | POA: Diagnosis not present

## 2024-01-01 DIAGNOSIS — D2262 Melanocytic nevi of left upper limb, including shoulder: Secondary | ICD-10-CM | POA: Diagnosis not present

## 2024-01-01 DIAGNOSIS — L570 Actinic keratosis: Secondary | ICD-10-CM | POA: Diagnosis not present

## 2024-01-01 DIAGNOSIS — D225 Melanocytic nevi of trunk: Secondary | ICD-10-CM | POA: Diagnosis not present

## 2024-01-01 DIAGNOSIS — Z85828 Personal history of other malignant neoplasm of skin: Secondary | ICD-10-CM | POA: Diagnosis not present

## 2024-01-01 DIAGNOSIS — D485 Neoplasm of uncertain behavior of skin: Secondary | ICD-10-CM | POA: Diagnosis not present

## 2024-01-01 DIAGNOSIS — D2261 Melanocytic nevi of right upper limb, including shoulder: Secondary | ICD-10-CM | POA: Diagnosis not present

## 2024-01-01 DIAGNOSIS — D0461 Carcinoma in situ of skin of right upper limb, including shoulder: Secondary | ICD-10-CM | POA: Diagnosis not present

## 2024-01-01 DIAGNOSIS — D2272 Melanocytic nevi of left lower limb, including hip: Secondary | ICD-10-CM | POA: Diagnosis not present

## 2024-01-01 DIAGNOSIS — D045 Carcinoma in situ of skin of trunk: Secondary | ICD-10-CM | POA: Diagnosis not present

## 2024-01-13 ENCOUNTER — Other Ambulatory Visit: Payer: Self-pay

## 2024-01-13 ENCOUNTER — Emergency Department

## 2024-01-13 ENCOUNTER — Inpatient Hospital Stay
Admission: EM | Admit: 2024-01-13 | Discharge: 2024-01-17 | DRG: 310 | Disposition: A | Discharge status: Home / Self Care | Attending: Internal Medicine | Admitting: Internal Medicine

## 2024-01-13 DIAGNOSIS — I480 Paroxysmal atrial fibrillation: Secondary | ICD-10-CM | POA: Diagnosis not present

## 2024-01-13 DIAGNOSIS — I472 Ventricular tachycardia, unspecified: Secondary | ICD-10-CM | POA: Diagnosis present

## 2024-01-13 DIAGNOSIS — Z8249 Family history of ischemic heart disease and other diseases of the circulatory system: Secondary | ICD-10-CM

## 2024-01-13 DIAGNOSIS — R079 Chest pain, unspecified: Secondary | ICD-10-CM | POA: Diagnosis not present

## 2024-01-13 DIAGNOSIS — I340 Nonrheumatic mitral (valve) insufficiency: Secondary | ICD-10-CM | POA: Diagnosis present

## 2024-01-13 DIAGNOSIS — S199XXA Unspecified injury of neck, initial encounter: Secondary | ICD-10-CM | POA: Diagnosis not present

## 2024-01-13 DIAGNOSIS — I493 Ventricular premature depolarization: Secondary | ICD-10-CM | POA: Diagnosis present

## 2024-01-13 DIAGNOSIS — E860 Dehydration: Secondary | ICD-10-CM | POA: Diagnosis present

## 2024-01-13 DIAGNOSIS — R55 Syncope and collapse: Secondary | ICD-10-CM | POA: Diagnosis present

## 2024-01-13 DIAGNOSIS — S0990XA Unspecified injury of head, initial encounter: Secondary | ICD-10-CM | POA: Diagnosis not present

## 2024-01-13 DIAGNOSIS — I4891 Unspecified atrial fibrillation: Secondary | ICD-10-CM | POA: Diagnosis not present

## 2024-01-13 DIAGNOSIS — I484 Atypical atrial flutter: Secondary | ICD-10-CM | POA: Diagnosis not present

## 2024-01-13 DIAGNOSIS — I471 Supraventricular tachycardia, unspecified: Secondary | ICD-10-CM | POA: Diagnosis not present

## 2024-01-13 DIAGNOSIS — R002 Palpitations: Secondary | ICD-10-CM | POA: Diagnosis present

## 2024-01-13 DIAGNOSIS — N289 Disorder of kidney and ureter, unspecified: Secondary | ICD-10-CM

## 2024-01-13 DIAGNOSIS — I700 Atherosclerosis of aorta: Secondary | ICD-10-CM | POA: Diagnosis not present

## 2024-01-13 DIAGNOSIS — I4892 Unspecified atrial flutter: Secondary | ICD-10-CM | POA: Diagnosis not present

## 2024-01-13 DIAGNOSIS — Z8619 Personal history of other infectious and parasitic diseases: Secondary | ICD-10-CM

## 2024-01-13 DIAGNOSIS — R000 Tachycardia, unspecified: Secondary | ICD-10-CM | POA: Diagnosis not present

## 2024-01-13 DIAGNOSIS — Z7901 Long term (current) use of anticoagulants: Secondary | ICD-10-CM

## 2024-01-13 DIAGNOSIS — Z1152 Encounter for screening for COVID-19: Secondary | ICD-10-CM

## 2024-01-13 DIAGNOSIS — I6782 Cerebral ischemia: Secondary | ICD-10-CM | POA: Diagnosis not present

## 2024-01-13 DIAGNOSIS — I272 Pulmonary hypertension, unspecified: Secondary | ICD-10-CM | POA: Diagnosis present

## 2024-01-13 DIAGNOSIS — Z87442 Personal history of urinary calculi: Secondary | ICD-10-CM

## 2024-01-13 DIAGNOSIS — W19XXXA Unspecified fall, initial encounter: Secondary | ICD-10-CM | POA: Diagnosis not present

## 2024-01-13 DIAGNOSIS — I959 Hypotension, unspecified: Secondary | ICD-10-CM | POA: Diagnosis present

## 2024-01-13 DIAGNOSIS — R112 Nausea with vomiting, unspecified: Secondary | ICD-10-CM

## 2024-01-13 DIAGNOSIS — R0689 Other abnormalities of breathing: Secondary | ICD-10-CM | POA: Diagnosis not present

## 2024-01-13 DIAGNOSIS — N1831 Chronic kidney disease, stage 3a: Secondary | ICD-10-CM | POA: Diagnosis present

## 2024-01-13 DIAGNOSIS — I361 Nonrheumatic tricuspid (valve) insufficiency: Secondary | ICD-10-CM | POA: Diagnosis not present

## 2024-01-13 LAB — CBC
HCT: 40.2 % (ref 39.0–52.0)
Hemoglobin: 12.9 g/dL — ABNORMAL LOW (ref 13.0–17.0)
MCH: 29.7 pg (ref 26.0–34.0)
MCHC: 32.1 g/dL (ref 30.0–36.0)
MCV: 92.4 fL (ref 80.0–100.0)
Platelets: 203 10*3/uL (ref 150–400)
RBC: 4.35 MIL/uL (ref 4.22–5.81)
RDW: 13.3 % (ref 11.5–15.5)
WBC: 12.7 10*3/uL — ABNORMAL HIGH (ref 4.0–10.5)
nRBC: 0 % (ref 0.0–0.2)

## 2024-01-13 LAB — BRAIN NATRIURETIC PEPTIDE: B Natriuretic Peptide: 275.2 pg/mL — ABNORMAL HIGH (ref 0.0–100.0)

## 2024-01-13 LAB — PHOSPHORUS: Phosphorus: 2.7 mg/dL (ref 2.5–4.6)

## 2024-01-13 LAB — BASIC METABOLIC PANEL WITH GFR
Anion gap: 9 (ref 5–15)
BUN: 37 mg/dL — ABNORMAL HIGH (ref 8–23)
CO2: 22 mmol/L (ref 22–32)
Calcium: 8.3 mg/dL — ABNORMAL LOW (ref 8.9–10.3)
Chloride: 108 mmol/L (ref 98–111)
Creatinine, Ser: 1.31 mg/dL — ABNORMAL HIGH (ref 0.61–1.24)
GFR, Estimated: 56 mL/min — ABNORMAL LOW (ref >60)
Glucose, Bld: 150 mg/dL — ABNORMAL HIGH (ref 70–99)
Potassium: 4.1 mmol/L (ref 3.5–5.1)
Sodium: 139 mmol/L (ref 135–145)

## 2024-01-13 LAB — MAGNESIUM: Magnesium: 1.8 mg/dL (ref 1.7–2.4)

## 2024-01-13 LAB — RESP PANEL BY RT-PCR (RSV, FLU A&B, COVID)  RVPGX2
Influenza A by PCR: NEGATIVE
Influenza B by PCR: NEGATIVE
Resp Syncytial Virus by PCR: NEGATIVE
SARS Coronavirus 2 by RT PCR: NEGATIVE

## 2024-01-13 LAB — HEPATIC FUNCTION PANEL
ALT: 15 U/L (ref 0–44)
AST: 26 U/L (ref 15–41)
Albumin: 3.7 g/dL (ref 3.5–5.0)
Alkaline Phosphatase: 68 U/L (ref 38–126)
Bilirubin, Direct: 0.2 mg/dL (ref 0.0–0.2)
Indirect Bilirubin: 1 mg/dL — ABNORMAL HIGH (ref 0.3–0.9)
Total Bilirubin: 1.2 mg/dL (ref 0.0–1.2)
Total Protein: 7.1 g/dL (ref 6.5–8.1)

## 2024-01-13 LAB — TROPONIN I (HIGH SENSITIVITY)
Troponin I (High Sensitivity): 15 ng/L (ref <18)
Troponin I (High Sensitivity): 19 ng/L — ABNORMAL HIGH (ref <18)

## 2024-01-13 MED ORDER — DILTIAZEM HCL 25 MG/5ML IV SOLN
10.0000 mg | Freq: Once | INTRAVENOUS | Status: AC
  Administered 2024-01-13: 10 mg via INTRAVENOUS
  Filled 2024-01-13: qty 5

## 2024-01-13 MED ORDER — ONDANSETRON HCL 4 MG/2ML IJ SOLN: 4.0000 mg | Freq: Four times a day (QID) | INTRAMUSCULAR | Status: DC | PRN

## 2024-01-13 MED ORDER — ACETAMINOPHEN 325 MG PO TABS: 650.0000 mg | ORAL_TABLET | ORAL | Status: DC | PRN

## 2024-01-13 MED ORDER — METOPROLOL TARTRATE 25 MG PO TABS
12.5000 mg | ORAL_TABLET | Freq: Two times a day (BID) | ORAL | Status: DC
  Administered 2024-01-13 (×2): 12.5 mg via ORAL
  Filled 2024-01-13 (×2): qty 1

## 2024-01-13 MED ORDER — SODIUM CHLORIDE 0.9 % IV BOLUS
500.0000 mL | Freq: Once | INTRAVENOUS | Status: AC
  Administered 2024-01-13: 500 mL via INTRAVENOUS

## 2024-01-13 MED ORDER — LACTATED RINGERS IV SOLN: INTRAVENOUS | Status: DC

## 2024-01-13 MED ORDER — APIXABAN 5 MG PO TABS
5.0000 mg | ORAL_TABLET | Freq: Two times a day (BID) | ORAL | Status: DC
  Administered 2024-01-13 – 2024-01-17 (×8): 5 mg via ORAL
  Filled 2024-01-13 (×8): qty 1

## 2024-01-13 NOTE — ED Provider Notes (Addendum)
 [facility] Provider Note    Event Date/Time   First MD Initiated Contact with Patient 01/13/24 1131     (approximate)   History   Tachycardia   HPI  Billy Carr is a 78 y.o. male with history of a flutter/atrial fibrillation status post ablation, SVT, presenting with 2 episodes of syncope.  Per EMS patient had a syncopal episode yesterday as well as today.  They found him to be in SVT at a rate of 150s.  Gave him 6 mg and 12 mg of adenosine, noted sinus pauses with those doses but SVT would recur.  Patient states that he felt bit lightheaded yesterday, went to the bathroom, stood up and passed out, hit his head on the right side.  Denies pain anywhere else.  No weakness or numbness.  States that he had a lot of nausea vomiting and diarrhea after his syncopal episode yesterday.  Had nausea vomiting and diarrhea again today.  Was in his closet and passed out again.  Was found by wife.  He states that preceding the passing out, he felt little bit lightheaded.  No abdominal pain, no urinary symptoms, no new cough or fever.  States that he felt subjectively warm today.  No chest pain or shortness of breath.  States that he does not take any medications, only takes vitamins, is not on any blood thinner or aspirin.  States that he is followed by Dr. Kirke Corin and was told he did not need any medications.  Patient's wife, he passed out yesterday and she found him, called 911, he declined to be transported yesterday.  Found him today passed on the closet.  He has no history of malignancy, no history of DVTs, he was complaining about some leg cramping but no unilateral calf swelling or tenderness.  No hormone use, no recent travel or surgeries.  States that he is typically able to p.o. when he goes into A-fib although he states that he feels asymptomatic right now with his heart rate in the 150s.  Independent history obtained from EMS and wife.  On independent chart review, he  was seen by Dr. Kirke Corin on January 14, had some palpitations in the setting of COVID booster in 2022, had outpatient Holter monitor done that showed several runs of VT and frequent short runs of SVT, had an echo done that also showed normal systolic function.  He was seen by Dr. Graciela Husbands who had a cardiac MRI done that was normal.     Physical Exam   Triage Vital Signs: ED Triage Vitals  Encounter Vitals Group     BP 01/13/24 1128 106/70     Systolic BP Percentile --      Diastolic BP Percentile --      Pulse Rate 01/13/24 1128 (!) 161     Resp 01/13/24 1128 (!) 22     Temp 01/13/24 1140 99.7 F (37.6 C)     Temp Source 01/13/24 1128 Oral     SpO2 01/13/24 1138 95 %     Weight 01/13/24 1134 195 lb (88.5 kg)     Height 01/13/24 1134 6' (1.829 m)     Head Circumference --      Peak Flow --      Pain Score 01/13/24 1134 0     Pain Loc --      Pain Education --      Exclude from Growth Chart --     Most recent vital signs:  Vitals:   01/13/24 1145 01/13/24 1230  BP: 101/67 98/63  Pulse:    Resp: (!) 26 19  Temp:    SpO2:       General: Awake, no distress.  CV:  Good peripheral perfusion.  Resp:  Normal effort.  Clear Abd:  No distention.  Soft nontender Other:  No palpable scalp deformities or tenderness, he has some mild ecchymoses as well as a healing superficial laceration to his left lateral eyebrow.  No facial tenderness or deformities, no midline spinal tenderness, thoracal thoracic cage tenderness, full range of motion of all extremities are intact, he has equal radial and DP pulses bilaterally, no unilateral calf swelling or tenderness, no lower extremity edema, no bony tenderness to his extremities.   ED Results / Procedures / Treatments   Labs (all labs ordered are listed, but only abnormal results are displayed) Labs Reviewed  BASIC METABOLIC PANEL WITH GFR - Abnormal; Notable for the following components:      Result Value   Glucose, Bld 150 (*)    BUN 37 (*)     Creatinine, Ser 1.31 (*)    Calcium 8.3 (*)    GFR, Estimated 56 (*)    All other components within normal limits  CBC - Abnormal; Notable for the following components:   WBC 12.7 (*)    Hemoglobin 12.9 (*)    All other components within normal limits  BRAIN NATRIURETIC PEPTIDE - Abnormal; Notable for the following components:   B Natriuretic Peptide 275.2 (*)    All other components within normal limits  HEPATIC FUNCTION PANEL - Abnormal; Notable for the following components:   Indirect Bilirubin 1.0 (*)    All other components within normal limits  RESP PANEL BY RT-PCR (RSV, FLU A&B, COVID)  RVPGX2  MAGNESIUM  PHOSPHORUS  TROPONIN I (HIGH SENSITIVITY)  TROPONIN I (HIGH SENSITIVITY)     EKG  Initial EKG showed SVT, rate 157, normal QS, normal QTc, no ischemic ST elevation, T wave flattening to 1 and aVL, this is change compared to prior.  Repeat EKG after IV Dilt, atrial fibrillation, rate of 128, normal QRS, normal QTc, no ischemic ST elevation, T wave flattening in 1, aVL, V2, this is change compared to prior EKG which showed SVT.   RADIOLOGY CT head on my independent potation without obvious intracranial hemorrhage   PROCEDURES:  Critical Care performed: Yes, see critical care procedure note(s)  .Critical Care  Performed by: Claybon Jabs, MD Authorized by: Claybon Jabs, MD   Critical care provider statement:    Critical care time (minutes):  45   Critical care was necessary to treat or prevent imminent or life-threatening deterioration of the following conditions:  Circulatory failure   Critical care was time spent personally by me on the following activities:  Development of treatment plan with patient or surrogate, discussions with consultants, evaluation of patient's response to treatment, examination of patient, ordering and review of laboratory studies, ordering and review of radiographic studies, ordering and performing treatments and interventions, pulse  oximetry, re-evaluation of patient's condition and review of old charts    MEDICATIONS ORDERED IN ED: Medications  sodium chloride 0.9 % bolus 500 mL (500 mLs Intravenous New Bag/Given 01/13/24 1133)  diltiazem (CARDIZEM) injection 10 mg (10 mg Intravenous Given 01/13/24 1145)  sodium chloride 0.9 % bolus 500 mL (500 mLs Intravenous New Bag/Given 01/13/24 1236)     IMPRESSION / MDM / ASSESSMENT AND PLAN / ED COURSE  I reviewed  the triage vital signs and the nursing notes.                              Differential diagnosis includes, but is not limited to, primary arrhythmia, ACS, dehydration, electrolyte derangements, gastroenteritis, viral illness, no abdominal pain on exam to suggest colitis or diverticulitis, imaging not indicated at this time, also considered CHF but patient is not volume overloaded, no shortness of breath, not hypoxic.  Considered PE but patient has no unilateral calf swelling or tenderness, not hypoxic, not short of breath, no other risk factors.  History of SVT that does not seem to respond with adenosine, I'm considering that this could also be a flutter.  Will give some IV fluids and 10 mg of IV Dilt to see if we can slow the rhythm and capture what is underlying.  For the fall he has no bony tenderness, does have a healing laceration to the left lateral eyebrow, will get a CT head as well as C-spine.  He will likely need to be admitted for further management of his arrhythmia and his syncopal episodes.  Shared decision making done with patient and family at bedside, they are agreeable with the plan.  Patient's presentation is most consistent with acute presentation with potential threat to life or bodily function.  Bedside ultrasound was done, no B-lines on the right, sparse 1-2 B-lines on the left, cardiac echo was severely limited, no obvious pericardial effusion, his IVC is collapsing.  Patient was given 10 mg of IV Dilt, heart rate slowed down to the 110s, rhythm  changed to atrial fibrillation.  Independent review of labs and imaging are below including cardiology consultation.  Given the patient has high risk, he will need to be admitted for further management.  Consulted hospitalist was agreeable with the plan for admission will evaluate the patient.  He is admitted.  Clinical Course as of 01/13/24 1321  Sat Jan 13, 2024  1233 DG Chest [place] 1 View No active disease.  [TT]  [phone] Consulted cardiology who agree that he should be admitted, get inpatient echo, states that he likely needs to be started on a DOAC. [TT]  1245 Independent review of labs, troponin is not elevated, leukocytosis, electrolyte severely deranged, he is a mild AKI, LFTs are not elevated, magnesium is normal, phosphorus is normal, BNP is mildly elevated but he does not appear fluid overloaded at this time [TT]  1314 CT Head Wo Contrast IMPRESSION: 1. No evidence of acute intracranial abnormality or cervical spine fracture. 2. Mild chronic small vessel ischemic disease.     [TT]    Clinical Course User Index [TT] Claybon Jabs, MD     FINAL CLINICAL IMPRESSION(S) / ED DIAGNOSES   Final diagnoses:  Atrial fibrillation with RVR (HCC)  Syncope, unspecified syncope type  Nausea vomiting and diarrhea  Dehydration  SVT (supraventricular tachycardia) (HCC)     Rx / DC Orders   ED Discharge Orders     None        Note:  This document was prepared using Dragon voice recognition software and may include unintentional dictation errors.    Claybon Jabs, MD 01/13/24 1321    Claybon Jabs, MD 01/13/24 [id number]

## 2024-01-13 NOTE — ED Notes (Signed)
 Hospitalist at bedside

## 2024-01-13 NOTE — Assessment & Plan Note (Signed)
 Per chart review, patient has a history of renal insufficiency with creatinine ranging between 1 and 1.2.  Currently 1.3, not meeting criteria for AKI.  - IV fluids as ordered

## 2024-01-13 NOTE — ED Notes (Signed)
 Dr. Jodie Echevaria EDMD at bedside.

## 2024-01-13 NOTE — ED Notes (Signed)
 Pt requesting ice chips. Per Dr. Jodie Echevaria okay to give pt a small amount of ice chips. Pt given roughly 10 ice chips.

## 2024-01-13 NOTE — Assessment & Plan Note (Addendum)
 Patient is presenting with 2 separate episodes of syncope with no prodrome concerning for cardiogenic etiology.  On EMS arrival, he was in SVT with rates as high as 160.  No once rates were slowed, demonstrated underlying rhythm of atrial fibrillation with RVR.  After examination, rates were persistently ventricular bigeminy.  - Telemetry monitoring - Cardiology consulted; appreciate their recommendations - Echocardiogram - S/p diltiazem 10 mg once - Start metoprolol 12.5 mg twice daily for PVC suppression and rate control - IV fluids given large volume vomiting with syncope

## 2024-01-13 NOTE — ED Triage Notes (Signed)
 Pt in via [facility] from home after x2 syncopal episodes that started last night. Pt endorses N/V/D last night. Pt fell and struck head last night and is not currently taking a blood thinner. Upon EMS arrival pt in SVT. Pt given 6 and 12 of adenosine with no resolution to HR. Pt HR is currently 157. Pt is A&Ox4 and ambulatory. Pt has hx of afib. Pt endorses fluttering in his chest x1 month. Pt has had a previous ablation.

## 2024-01-13 NOTE — H&P (Signed)
 History and Physical    Patient: Billy Carr ZOX:[id number] DOB: 08/29/1946 DOA: 01/13/2024 DOS: the patient was seen and examined on 01/13/2024 PCP: Marisue Ivan, MD  Patient coming from: Home  Chief Complaint:  Chief Complaint  Patient presents with   Tachycardia   HPI: Billy Carr is a 78 y.o. male with medical history significant of paroxysmal atrial fibrillation s/p ablation (2015, 2016), SVT, prediabetes, CKD stage IIIa, who presents to the ED due to syncope.  Billy Carr states that for the last month, he could feel that he was starting to experience palpitations.  He had contacted his cardiologist and they were planning to have a regular office visit.  Then yesterday, he awoke in the evening with a leg cramp and so he stood up to walk around.  Next thing he knew, he woke up on the ground in the bathroom covered in vomit with fecal incontinence.  He hit the left side of his face.  He was not sure what happened.  His wife said that he came to immediately and was not disoriented.  EMS was called but he did not want to come to the ER.  Then today, he got up and was walking to the closet.  His wife heard a loud thud and when she went to check on him, he was lying on the ground.  He did not remember how he got there.  He denies any prodromal symptoms including dizziness, chest pain, shortness of breath.  ED course: On EMS arrival, patient was noted to be in SVTs with rates as high as 150-160.  He was given adenosine twice with no improvement.  On arrival to the ED, patient was normotensive at 101/67 with heart rate of 158.  He was saturating at 95% on room air.  He was afebrile at 99.7.  He was given a one-time dose of diltiazem with improvement of rates down to 105, demonstrating atrial fibrillation with RVR.  Patient was started on IV fluids.  Cardiology consulted.  [facility] contacted for admission.  Review of Systems: As mentioned in the history of present illness. All other systems  reviewed and are negative.  Past Medical History:  Diagnosis Date   Atrial flutter/fib 10/27/2011   flecainide::CHADSVASc--1>> no anticoag 2/14   Atrial tachycardia (HCC)    Bulging disc    cervical, no limitations   History of shingles    Nephrolithiasis    PVC (premature ventricular contraction)    asymptomatic   Syncope    Neurally mediated-presumed::also ?assoc with AFib   Vertical nystagmus    no recent issues   Wears dentures    full upper, partial lower   Past Surgical History:  Procedure Laterality Date   CARDIAC ELECTROPHYSIOLOGY STUDY AND ABLATION  2015, 2016   COLONOSCOPY WITH PROPOFOL N/A 10/03/2016   Procedure: COLONOSCOPY WITH PROPOFOL;  Surgeon: Midge Minium, MD;  Location: [facility];  Service: Endoscopy;  Laterality: N/A;   EXTRACORPOREAL SHOCK WAVE LITHOTRIPSY Left 02/09/2017   Procedure: EXTRACORPOREAL SHOCK WAVE LITHOTRIPSY (ESWL);  Surgeon: Vanna Scotland, MD;  Location: [facility];  Service: Urology;  Laterality: Left;   HERNIA REPAIR     bilateral   NEPHROSTOMY     open, left kidney stone   SQUAMOUS CELL CARCINOMA EXCISION     Head   Social History:  reports that he has never smoked. He has never used smokeless tobacco. He reports that he does not drink alcohol and does not use drugs.  No Known Allergies  Family History  Problem Relation Age of Onset   Heart failure Mother     Prior to Admission medications   Medication Sig Start Date End Date Taking? Authorizing Provider  Multiple Vitamin (MULTIVITAMIN) tablet Take 1 tablet by mouth daily.    [provider]  Multiple Vitamins-Minerals (ZINC PO) Take by mouth daily.    [provider]  vitamin C (ASCORBIC ACID) 500 MG tablet Take 500 mg by mouth daily.    [provider]  VITAMIN D PO Take by mouth daily.    [provider]    Physical Exam: Vitals:   01/13/24 1145 01/13/24 1230 01/13/24 1330 01/13/24 1345  BP: 101/67 98/63 100/60 106/64  Pulse:       Resp: (!) 26 19 13  (!) 23  Temp:      TempSrc:      SpO2:      Weight:      Height:       Physical Exam Vitals and nursing note reviewed.  Constitutional:      General: He is not in acute distress.    Appearance: He is normal weight. He is not toxic-appearing.  HENT:     Head: Normocephalic and atraumatic.     Mouth/Throat:     Mouth: Mucous membranes are dry.     Pharynx: Oropharynx is clear.  Eyes:     Conjunctiva/sclera: Conjunctivae normal.     Pupils: Pupils are equal, round, and reactive to light.  Cardiovascular:     Rate and Rhythm: Tachycardia present. Rhythm irregular.     Heart sounds: Heart sounds are distant (very). No murmur heard. Pulmonary:     Effort: Pulmonary effort is normal. No respiratory distress.     Breath sounds: Normal breath sounds. No wheezing or rales.  Abdominal:     General: Bowel sounds are normal. There is no distension.     Palpations: Abdomen is soft.     Tenderness: There is no abdominal tenderness. There is no guarding.  Musculoskeletal:     Right lower leg: No edema.     Left lower leg: No edema.  Skin:    General: Skin is warm and dry.  Neurological:     General: No focal deficit present.     Mental Status: He is alert and oriented to person, place, and time. Mental status is at baseline.  Psychiatric:        Mood and Affect: Mood normal.        Behavior: Behavior normal.    Data Reviewed: CBC with WBC of 12.7, hemoglobin of 12.9, platelets of 203 CMP with sodium of 139, potassium 4.1, bicarb 22, glucose 150, BUN 37, creat 1.31, AST 26, ALT 15, GFR of 56 Troponin 15 and then 19 BNP 275 COVID #19, influenza and RSV PCR negative  Initial EKG personally reviewed.  Irregular narrow complex tachycardia with rate of 157, most consistent with SVT.  Repeat EKG with rate of 128, this time more consistent with atrial fibrillation  Telemetry reviewed.  Long stretches of ventricular bigeminy noted  CT Head Wo Contrast Result Date:  01/13/2024 CLINICAL DATA:  Head trauma, minor (Age >= 65y); Neck trauma (Age >= 65y). Two syncopal episodes. Head strike. Nausea and vomiting. EXAM: CT HEAD WITHOUT CONTRAST CT CERVICAL SPINE WITHOUT CONTRAST TECHNIQUE: Multidetector CT imaging of the head and cervical spine was performed following the standard protocol without intravenous contrast. Multiplanar CT image reconstructions of the cervical spine were also generated. RADIATION DOSE REDUCTION: This  exam was performed according to the departmental dose-optimization program which includes automated exposure control, adjustment of the mA and/or kV according to patient size and/or use of iterative reconstruction technique. COMPARISON:  CT head and cervical spine 07/31/2012 FINDINGS: CT HEAD FINDINGS Brain: There is no evidence of an acute infarct, intracranial hemorrhage, mass, midline shift, or extra-axial fluid collection. Cerebral volume is within normal limits for age. The ventricles are normal in size. Cerebral white matter hypodensities are nonspecific but compatible with mild chronic small vessel ischemic disease. Vascular: Calcified atherosclerosis at the skull base. No hyperdense vessel. Skull: No acute fracture or suspicious lesion. Sinuses/Orbits: Visualized paranasal sinuses and mastoid air cells are clear. Bilateral cataract extraction. Other: None. CT CERVICAL SPINE FINDINGS Alignment: Straightening of the normal cervical lordosis. Chronic trace anterolisthesis of C7 on T1. Skull base and vertebrae: No acute fracture or suspicious lesion. Soft tissues and spinal canal: No prevertebral fluid or swelling. No visible canal hematoma. Disc levels: Diffuse cervical disc degeneration, greatest at C5-6 and C6-7 with at least mild spinal stenosis and moderate neural foraminal stenosis at both levels. Mild-to-moderate multilevel facet arthrosis. Upper chest: The included lung apices are clear. Other: Mild atherosclerotic calcification at the carotid  bifurcations. IMPRESSION: 1. No evidence of acute intracranial abnormality or cervical spine fracture. 2. Mild chronic small vessel ischemic disease. Electronically Signed   By: Billy Carr M.D.   On: 01/13/2024 12:45   CT Cervical Spine Wo Contrast Result Date: 01/13/2024 CLINICAL DATA:  Head trauma, minor (Age >= 65y); Neck trauma (Age >= 65y). Two syncopal episodes. Head strike. Nausea and vomiting. EXAM: CT HEAD WITHOUT CONTRAST CT CERVICAL SPINE WITHOUT CONTRAST TECHNIQUE: Multidetector CT imaging of the head and cervical spine was performed following the standard protocol without intravenous contrast. Multiplanar CT image reconstructions of the cervical spine were also generated. RADIATION DOSE REDUCTION: This exam was performed according to the departmental dose-optimization program which includes automated exposure control, adjustment of the mA and/or kV according to patient size and/or use of iterative reconstruction technique. COMPARISON:  CT head and cervical spine 07/31/2012 FINDINGS: CT HEAD FINDINGS Brain: There is no evidence of an acute infarct, intracranial hemorrhage, mass, midline shift, or extra-axial fluid collection. Cerebral volume is within normal limits for age. The ventricles are normal in size. Cerebral white matter hypodensities are nonspecific but compatible with mild chronic small vessel ischemic disease. Vascular: Calcified atherosclerosis at the skull base. No hyperdense vessel. Skull: No acute fracture or suspicious lesion. Sinuses/Orbits: Visualized paranasal sinuses and mastoid air cells are clear. Bilateral cataract extraction. Other: None. CT CERVICAL SPINE FINDINGS Alignment: Straightening of the normal cervical lordosis. Chronic trace anterolisthesis of C7 on T1. Skull base and vertebrae: No acute fracture or suspicious lesion. Soft tissues and spinal canal: No prevertebral fluid or swelling. No visible canal hematoma. Disc levels: Diffuse cervical disc degeneration,  greatest at C5-6 and C6-7 with at least mild spinal stenosis and moderate neural foraminal stenosis at both levels. Mild-to-moderate multilevel facet arthrosis. Upper chest: The included lung apices are clear. Other: Mild atherosclerotic calcification at the carotid bifurcations. IMPRESSION: 1. No evidence of acute intracranial abnormality or cervical spine fracture. 2. Mild chronic small vessel ischemic disease. Electronically Signed   By: Billy Carr M.D.   On: 01/13/2024 12:45   DG Chest Port 1 View Result Date: 01/13/2024 CLINICAL DATA:  Chest pain EXAM: PORTABLE CHEST 1 VIEW COMPARISON:  07/31/2012 FINDINGS: The heart size and mediastinal contours are within normal limits. Aortic atherosclerosis. No focal airspace consolidation,  pleural effusion, or pneumothorax. The visualized skeletal structures are unremarkable. IMPRESSION: No active disease. Electronically Signed   By: Duanne Guess D.O.   On: 01/13/2024 12:24   Results are pending, will review when available.  Assessment and Plan:  * Syncope Patient is presenting with 2 separate episodes of syncope with no prodrome concerning for cardiogenic etiology.  On EMS arrival, he was in SVT with rates as high as 160.  No once rates were slowed, demonstrated underlying rhythm of atrial fibrillation with RVR.  After examination, rates were persistently ventricular bigeminy.  - Telemetry monitoring - Cardiology consulted; appreciate their recommendations - Echocardiogram - S/p diltiazem 10 mg once - Start metoprolol 12.5 mg twice daily for PVC suppression and rate control - IV fluids given large volume vomiting with syncope  Atrial fibrillation with RVR (HCC) Previous history of atrial fibrillation s/p ablation in 2015 and 2016 with no reoccurrence.  After receiving diltiazem, EKG demonstrated atrial fibrillation with RVR.  Previous CHA2DS2-VASc of 1, now 2 given age.  - Telemetry monitoring - Cardiology consulted; appreciate their  recommendations - Start Eliquis 5 mg twice daily  Renal insufficiency Per chart review, patient has a history of renal insufficiency with creatinine ranging between 1 and 1.2.  Currently 1.3, not meeting criteria for AKI.  - IV fluids as ordered  Advance Care Planning:   Code Status: Full Code Confirmed.   Consults: Cardiology  Family Communication: Patient's wife and daughter updated at bedside  Severity of Illness: The appropriate patient status for this patient is OBSERVATION. Observation status is judged to be reasonable and necessary in order to provide the required intensity of service to ensure the patient's safety. The patient's presenting symptoms, physical exam findings, and initial radiographic and laboratory data in the context of their medical condition is felt to place them at decreased risk for further clinical deterioration. Furthermore, it is anticipated that the patient will be medically stable for discharge from the hospital within 2 midnights of admission.   Author: Verdene Lennert, MD 01/13/2024 2:47 PM  For on call review [email or web address].

## 2024-01-13 NOTE — ED Notes (Signed)
 Family at bedside.

## 2024-01-13 NOTE — Assessment & Plan Note (Addendum)
 Previous history of atrial fibrillation s/p ablation in 2015 and 2016 with no reoccurrence.  After receiving diltiazem, EKG demonstrated atrial fibrillation with RVR.  Previous CHA2DS2-VASc of 1, now 2 given age.  - Telemetry monitoring - Cardiology consulted; appreciate their recommendations - Start Eliquis 5 mg twice daily

## 2024-01-14 ENCOUNTER — Inpatient Hospital Stay (HOSPITAL_COMMUNITY)
Admit: 2024-01-14 | Discharge: 2024-01-14 | Disposition: A | Discharge status: Home / Self Care | Attending: Internal Medicine | Admitting: Internal Medicine

## 2024-01-14 DIAGNOSIS — I493 Ventricular premature depolarization: Secondary | ICD-10-CM | POA: Diagnosis not present

## 2024-01-14 DIAGNOSIS — R002 Palpitations: Secondary | ICD-10-CM | POA: Diagnosis not present

## 2024-01-14 DIAGNOSIS — R079 Chest pain, unspecified: Secondary | ICD-10-CM | POA: Diagnosis not present

## 2024-01-14 DIAGNOSIS — N289 Disorder of kidney and ureter, unspecified: Secondary | ICD-10-CM | POA: Diagnosis not present

## 2024-01-14 DIAGNOSIS — Z8249 Family history of ischemic heart disease and other diseases of the circulatory system: Secondary | ICD-10-CM | POA: Diagnosis not present

## 2024-01-14 DIAGNOSIS — R55 Syncope and collapse: Secondary | ICD-10-CM | POA: Diagnosis not present

## 2024-01-14 DIAGNOSIS — I471 Supraventricular tachycardia, unspecified: Secondary | ICD-10-CM | POA: Diagnosis not present

## 2024-01-14 DIAGNOSIS — Z8619 Personal history of other infectious and parasitic diseases: Secondary | ICD-10-CM | POA: Diagnosis not present

## 2024-01-14 DIAGNOSIS — Z87442 Personal history of urinary calculi: Secondary | ICD-10-CM | POA: Diagnosis not present

## 2024-01-14 DIAGNOSIS — Z1152 Encounter for screening for COVID-19: Secondary | ICD-10-CM | POA: Diagnosis not present

## 2024-01-14 DIAGNOSIS — S0990XA Unspecified injury of head, initial encounter: Secondary | ICD-10-CM | POA: Diagnosis not present

## 2024-01-14 DIAGNOSIS — I6782 Cerebral ischemia: Secondary | ICD-10-CM | POA: Diagnosis not present

## 2024-01-14 DIAGNOSIS — I4891 Unspecified atrial fibrillation: Secondary | ICD-10-CM

## 2024-01-14 DIAGNOSIS — W19XXXA Unspecified fall, initial encounter: Secondary | ICD-10-CM | POA: Diagnosis not present

## 2024-01-14 DIAGNOSIS — E860 Dehydration: Secondary | ICD-10-CM | POA: Diagnosis not present

## 2024-01-14 DIAGNOSIS — I959 Hypotension, unspecified: Secondary | ICD-10-CM | POA: Diagnosis not present

## 2024-01-14 DIAGNOSIS — I484 Atypical atrial flutter: Secondary | ICD-10-CM | POA: Diagnosis not present

## 2024-01-14 DIAGNOSIS — I700 Atherosclerosis of aorta: Secondary | ICD-10-CM | POA: Diagnosis not present

## 2024-01-14 DIAGNOSIS — I4892 Unspecified atrial flutter: Secondary | ICD-10-CM | POA: Diagnosis not present

## 2024-01-14 DIAGNOSIS — I272 Pulmonary hypertension, unspecified: Secondary | ICD-10-CM | POA: Diagnosis not present

## 2024-01-14 DIAGNOSIS — N1831 Chronic kidney disease, stage 3a: Secondary | ICD-10-CM | POA: Diagnosis not present

## 2024-01-14 DIAGNOSIS — Z7901 Long term (current) use of anticoagulants: Secondary | ICD-10-CM | POA: Diagnosis not present

## 2024-01-14 DIAGNOSIS — I480 Paroxysmal atrial fibrillation: Secondary | ICD-10-CM | POA: Diagnosis not present

## 2024-01-14 DIAGNOSIS — I361 Nonrheumatic tricuspid (valve) insufficiency: Secondary | ICD-10-CM | POA: Diagnosis not present

## 2024-01-14 DIAGNOSIS — S199XXA Unspecified injury of neck, initial encounter: Secondary | ICD-10-CM | POA: Diagnosis not present

## 2024-01-14 DIAGNOSIS — I472 Ventricular tachycardia, unspecified: Secondary | ICD-10-CM | POA: Diagnosis not present

## 2024-01-14 DIAGNOSIS — I340 Nonrheumatic mitral (valve) insufficiency: Secondary | ICD-10-CM | POA: Diagnosis not present

## 2024-01-14 LAB — CBG MONITORING, ED: Glucose-Capillary: 114 mg/dL — ABNORMAL HIGH (ref 70–99)

## 2024-01-14 MED ORDER — ADULT MULTIVITAMIN W/MINERALS CH
1.0000 | ORAL_TABLET | Freq: Every day | ORAL | Status: DC
Start: 2024-01-14 — End: 2024-01-17
  Administered 2024-01-14 – 2024-01-17 (×4): 1 via ORAL
  Filled 2024-01-14 (×4): qty 1

## 2024-01-14 MED ORDER — SODIUM CHLORIDE 0.9 % IV BOLUS
250.0000 mL | Freq: Once | INTRAVENOUS | Status: AC
  Administered 2024-01-14: 250 mL via INTRAVENOUS

## 2024-01-14 MED ORDER — METOPROLOL SUCCINATE ER 25 MG PO TB24
12.5000 mg | ORAL_TABLET | Freq: Every day | ORAL | Status: DC
  Administered 2024-01-14 – 2024-01-17 (×4): 12.5 mg via ORAL
  Filled 2024-01-14 (×4): qty 1

## 2024-01-14 MED ORDER — MIDODRINE HCL 5 MG PO TABS
20.0000 mg | ORAL_TABLET | Freq: Once | ORAL | Status: AC
  Administered 2024-01-14: 20 mg via ORAL
  Filled 2024-01-14: qty 4

## 2024-01-14 MED ORDER — MAGNESIUM SULFATE 2 GM/50ML IV SOLN
2.0000 g | Freq: Once | INTRAVENOUS | Status: AC
  Administered 2024-01-14: 2 g via INTRAVENOUS
  Filled 2024-01-14: qty 50

## 2024-01-14 MED ORDER — VITAMIN C 500 MG PO TABS
500.0000 mg | ORAL_TABLET | Freq: Every day | ORAL | Status: DC
  Administered 2024-01-14 – 2024-01-17 (×4): 500 mg via ORAL
  Filled 2024-01-14 (×4): qty 1

## 2024-01-14 MED ORDER — METOPROLOL SUCCINATE ER 25 MG PO TB24
12.5000 mg | ORAL_TABLET | Freq: Every day | ORAL | Status: DC
Start: 2024-01-14 — End: 2024-01-14

## 2024-01-14 NOTE — ED Notes (Signed)
 Pt sitting in bed NAD, family at bedside. Pt denies any needs at this time.

## 2024-01-14 NOTE — ED Notes (Signed)
 Echo at bedside

## 2024-01-14 NOTE — Progress Notes (Signed)
 [facility]  - [place] at [facility]   PATIENT NAME: Billy Carr    MR#:  [id number]  DATE OF BIRTH:  01-16-1946  SUBJECTIVE:  family at bedside in the ER. Patient overall feels better. Remains in sinus rhythm on the monitor sats stable. Denies chest pain.  VITALS:  Blood pressure 99/69, pulse 75, temperature 98 F (36.7 C), resp. rate 19, height 6' (1.829 m), weight 88.5 kg, SpO2 95%.  PHYSICAL EXAMINATION:   GENERAL:  78 y.o.-year-old patient with no acute distress.  LUNGS: Normal breath sounds bilaterally, no wheezing CARDIOVASCULAR: S1, S2 normal. No murmur   ABDOMEN: Soft, nontender, nondistended. Bowel sounds present.  EXTREMITIES: No  edema b/l.    NEUROLOGIC: nonfocal  patient is alert and awake  LABORATORY PANEL:  CBC Recent Labs  Lab 01/13/24 1129  WBC 12.7*  HGB 12.9*  HCT 40.2  PLT 203    Chemistries  Recent Labs  Lab 01/13/24 1129 01/13/24 1133  NA 139  --   K 4.1  --   CL 108  --   CO2 22  --   GLUCOSE 150*  --   BUN 37*  --   CREATININE 1.31*  --   CALCIUM 8.3*  --   MG  --  1.8  AST  --  26  ALT  --  15  ALKPHOS  --  68  BILITOT  --  1.2   Cardiac Enzymes No results for input(s): "TROPONINI" in the last 168 hours. RADIOLOGY:  CT Head Wo Contrast Result Date: 01/13/2024 CLINICAL DATA:  Head trauma, minor (Age >= 65y); Neck trauma (Age >= 65y). Two syncopal episodes. Head strike. Nausea and vomiting. EXAM: CT HEAD WITHOUT CONTRAST CT CERVICAL SPINE WITHOUT CONTRAST TECHNIQUE: Multidetector CT imaging of the head and cervical spine was performed following the standard protocol without intravenous contrast. Multiplanar CT image reconstructions of the cervical spine were also generated. RADIATION DOSE REDUCTION: This exam was performed according to the departmental dose-optimization program which includes automated exposure control, adjustment of the mA and/or kV according to patient size and/or use of iterative reconstruction  technique. COMPARISON:  CT head and cervical spine 07/31/2012 FINDINGS: CT HEAD FINDINGS Brain: There is no evidence of an acute infarct, intracranial hemorrhage, mass, midline shift, or extra-axial fluid collection. Cerebral volume is within normal limits for age. The ventricles are normal in size. Cerebral white matter hypodensities are nonspecific but compatible with mild chronic small vessel ischemic disease. Vascular: Calcified atherosclerosis at the skull base. No hyperdense vessel. Skull: No acute fracture or suspicious lesion. Sinuses/Orbits: Visualized paranasal sinuses and mastoid air cells are clear. Bilateral cataract extraction. Other: None. CT CERVICAL SPINE FINDINGS Alignment: Straightening of the normal cervical lordosis. Chronic trace anterolisthesis of C7 on T1. Skull base and vertebrae: No acute fracture or suspicious lesion. Soft tissues and spinal canal: No prevertebral fluid or swelling. No visible canal hematoma. Disc levels: Diffuse cervical disc degeneration, greatest at C5-6 and C6-7 with at least mild spinal stenosis and moderate neural foraminal stenosis at both levels. Mild-to-moderate multilevel facet arthrosis. Upper chest: The included lung apices are clear. Other: Mild atherosclerotic calcification at the carotid bifurcations. IMPRESSION: 1. No evidence of acute intracranial abnormality or cervical spine fracture. 2. Mild chronic small vessel ischemic disease. Electronically Signed   By: Sebastian Ache M.D.   On: 01/13/2024 12:45   CT Cervical Spine Wo Contrast Result Date: 01/13/2024 CLINICAL DATA:  Head trauma, minor (Age >= 65y); Neck trauma (Age >= 65y).  Two syncopal episodes. Head strike. Nausea and vomiting. EXAM: CT HEAD WITHOUT CONTRAST CT CERVICAL SPINE WITHOUT CONTRAST TECHNIQUE: Multidetector CT imaging of the head and cervical spine was performed following the standard protocol without intravenous contrast. Multiplanar CT image reconstructions of the cervical spine were  also generated. RADIATION DOSE REDUCTION: This exam was performed according to the departmental dose-optimization program which includes automated exposure control, adjustment of the mA and/or kV according to patient size and/or use of iterative reconstruction technique. COMPARISON:  CT head and cervical spine 07/31/2012 FINDINGS: CT HEAD FINDINGS Brain: There is no evidence of an acute infarct, intracranial hemorrhage, mass, midline shift, or extra-axial fluid collection. Cerebral volume is within normal limits for age. The ventricles are normal in size. Cerebral white matter hypodensities are nonspecific but compatible with mild chronic small vessel ischemic disease. Vascular: Calcified atherosclerosis at the skull base. No hyperdense vessel. Skull: No acute fracture or suspicious lesion. Sinuses/Orbits: Visualized paranasal sinuses and mastoid air cells are clear. Bilateral cataract extraction. Other: None. CT CERVICAL SPINE FINDINGS Alignment: Straightening of the normal cervical lordosis. Chronic trace anterolisthesis of C7 on T1. Skull base and vertebrae: No acute fracture or suspicious lesion. Soft tissues and spinal canal: No prevertebral fluid or swelling. No visible canal hematoma. Disc levels: Diffuse cervical disc degeneration, greatest at C5-6 and C6-7 with at least mild spinal stenosis and moderate neural foraminal stenosis at both levels. Mild-to-moderate multilevel facet arthrosis. Upper chest: The included lung apices are clear. Other: Mild atherosclerotic calcification at the carotid bifurcations. IMPRESSION: 1. No evidence of acute intracranial abnormality or cervical spine fracture. 2. Mild chronic small vessel ischemic disease. Electronically Signed   By: Sebastian Ache M.D.   On: 01/13/2024 12:45   DG Chest Port 1 View Result Date: 01/13/2024 CLINICAL DATA:  Chest pain EXAM: PORTABLE CHEST 1 VIEW COMPARISON:  07/31/2012 FINDINGS: The heart size and mediastinal contours are within normal  limits. Aortic atherosclerosis. No focal airspace consolidation, pleural effusion, or pneumothorax. The visualized skeletal structures are unremarkable. IMPRESSION: No active disease. Electronically Signed   By: Duanne Guess D.O.   On: 01/13/2024 12:24    Assessment and Plan  AMEL GIANINO is a 78 y.o. male with medical history significant of paroxysmal atrial fibrillation s/p ablation (2015, 2016), SVT, prediabetes, CKD stage IIIa, who presents to the ED due to syncope x 2 On EMS arrival, patient was noted to be in SVTs with rates as high as 150-160. Patient received IV diltiazem times one and PO beta-blockers. His blood pressure went down in the 60s. Patient normally has low blood pressure at baseline. He was given a dose of midodrine and IV fluids.  Syncope Patient is presenting with 2 separate episodes of syncope with no prodrome concerning for cardiogenic etiology.  On EMS arrival, he was in SVT with rates as high as 160.  No once rates were slowed, demonstrated underlying rhythm of atrial fibrillation with RVR.  After examination, rates were persistently ventricular bigeminy.  - [facility] consulted--EP to see on monday - Echocardiogram pending results - S/p diltiazem 10 mg once - Start metoprolol 12.5 mg twice daily for PVC suppression and rate control--now held due to soft BP - patient currently remains in sinus rhythm however heart rate goes up in the 150s with ambulation in the ER.   Atrial fibrillation with RVR (HCC) --Previous history of atrial fibrillation s/p ablation in 2015 and 2016 with no reoccurrence.  After receiving diltiazem, EKG demonstrated atrial fibrillation with RVR.   --  CHA2DS2-VASc of 2 given age.  - Now on Eliquis 5 mg twice daily   Renal insufficiency Per chart review, patient has a history of renal insufficiency with creatinine ranging between 1 and 1.2.  Currently 1.3, not meeting criteria for AKI.  - Recieved  IV fluids --BMP in  am   Procedures: Family communication : wife Consults : [facility] cardiology CODE STATUS: full DVT Prophylaxis : eliquis Level of care: Telemetry Cardiac Status is: Observation The patient remains OBS appropriate and will d/c before 2 midnights.    TOTAL TIME TAKING CARE OF THIS PATIENT: 45 minutes.  >50% time spent on counselling and coordination of care  Note: This dictation was prepared with Dragon dictation along with smaller phrase technology. Any transcriptional errors that result from this process are unintentional.  Enedina Finner M.D    [facility]   CC: Primary care physician; Marisue Ivan, MD

## 2024-01-14 NOTE — Consult Note (Signed)
 Cardiology Consultation   Patient ID: Billy Carr MRN: [id number]; DOB: [birth year]  Admit date: 01/13/2024 Date of Consult: 01/14/2024  PCP:  Marisue Ivan, MD   [facility] Providers Cardiologist:  Lorine Bears, MD        Patient Profile:   Billy Carr is a 78 y.o. male with a hx of paroxysmal atrial fibrillation status post RF ablation with PVI (10/30/2013) with repeat ablation (05/22/2014), atypical atrial flutter, SVT, atrial tachycardia with variable block, orthostatic intolerance, history of syncope, who is being seen 01/14/2024 for the evaluation of syncope, SVT, A-fib RVR at the request of Dr. Alvester Morin.  History of Present Illness:     [name] had an event recorder that was negative in 2012, stress test demonstrated normal left ventricular function with 90% achieved maximal heart rate and in no ischemia.  February 2013 had tachypalpitations and was found to be in atrial flutter which terminated spontaneously a couple of days afterwards.  He was having nocturnal tachypalpitations and an event recorder was ordered which revealed atrial fibrillation with spells occurring in clusters with the longest of which was about an hour and a half with an average heart rate of 110 and peak heart rates in the 160s.  He presented on 04/2012 after 3 syncopal episodes that were all of a sudden.  He was noted to be in atrial flutter and mildly tachycardic by EMS.  Telemetry monitoring revealed atrial fibrillation.  CT of the head was unremarkable, carotid duplex showed no significant disease, he converted to sinus rhythm with metoprolol and was started on flecainide 50 mg twice daily as well as rivaroxaban for anticoagulation.  Underwent RF ablation with PVI in January 2015.  Previously on flecainide in 2014 followed by Tikosyn in 12/2013.  Repeat ablation in August 2015. upon discontinuation he noted increased A-fib burden.  He had previously been followed by [facility] cardiology.   Echocardiogram in 2015 revealed a normal EF, mild MR, ASD, aortic sclerosis.  Outpatient monitoring in 2016 showed no A-fib with underlying predominant rhythm of sinus with runs of SVT.  In 2017 he was seen for increased palpitations with some atypical chest pain.  Underwent treadmill nuclear stress testing which showed no evidence of ischemia with normal EF.  Echo at that time showed normal LV systolic function, mild MR, mildly dilated aortic root at 39 mm.  Holter monitor showed normal sinus rhythm with mild PACs and PVCs.  He had an episode of syncope while in the recovery area at [facility] in 2018 after lithotripsy that was felt to be vasovagal.  He was also noted to be bradycardic on metoprolol which was discontinued at the time.  Repeat echocardiogram showed normal LV systolic function, mild mitral and tricuspid regurgitation and mild pulmonary hypertension.  He had worsening palpitations in 2022 in the setting of taking a COVID-19 booster.  Outpatient monitor done which showed 7 runs of ventricular tachycardia and frequent short runs of SVT.  Repeat echocardiogram was done which showed normal LV systolic function and mild to moderate mitral regurgitation.  Treadmill nuclear stress testing showed no evidence of ischemia.  He was able to exercise for 9 minutes and 30 seconds.  He was seen by Dr. Graciela Husbands who recommended a cardiac MRI which came back normal.  There was no further intervention that was needed.  He was last seen in clinic 10/31/2023.  He was doing well since with no chest pain, shortness of breath or palpitations.  Continue to be active and  exercise on a regular basis with no exertional symptoms.  No recent symptoms from his atrial fibrillation.  No further incidence of vasovagal syncope since stopping his metoprolol.  And no further transient nonsustained ventricular tachycardia.  There were no further testing that was ordered and no medication changes that were made at that time.   He presented to  [facility] emergency department 01/13/2024 via EMS from home after 2 syncopal episodes that started the previous night. Had increased nausea with vomiting and diarrhea at the night before after his syncopal episode. He fell and struck his head last night and was not currently on blood thinner.  Upon EMS arrival he was in SVT.  He was treated with 6 mg and 12 mg of adenosine with no resolution of elevated heart rate.  On arrival to the emergency department heart rate was 157 bpm.  He endorsed fluttering with palpitations in his chest for approximately 1 month.  Per the patient's wife she stated that he had passed out 2024-02-18 and she found him and called 911 and he declined to be transported. Saturday she heard a thud and found him today passed out in the closet.  States that he had previously been on metoprolol that had to be stopped due to lower heart rates.  Had not had any further episodes and stated that he did not need any as needed medications to take for elevated heart rates at his last visit.  Overnight he received metoprolol 12.5 mg after the first dose blood pressure was 98/69-second dose later in the night was given blood pressure was 66/37.  He was given 250 ml bolus of LR x 2 and given a one-time dose of midodrine 20 mg.  Initial vital signs: Blood pressure 106/70, pulse of 161, respirations of 22, temperature 99.7  Pertinent labs: Blood glucose 150, BUN 37, serum creatinine 1.31, calcium 8.3, hemoglobin 12.9, BN P2 75.2, indirect bilirubin was 1, high-sensitivity troponin 15 and 19, respiratory panel negative for COVID, flu A/B, and RSV, magnesium 1.8, phosphorus 2.7  Imaging: Chest x-ray revealed no acute cardiopulmonary disease; CT of the head showed no evidence of acute intracranial abnormality or cervical spine fracture, mild chronic small vessel ischemic disease; CT cervical spine revealed no acute fracture or suspicious lesion, diffuse cervical disc degeneration, mild to moderate multilevel facet  arthrosis  Medications administered in the emergency department: 1 L of normal saline 10 mg of diltiazem IVPB  Cardiology was consulted for syncopal episodes x 2, SVT received adenosine 4, and atrial fibrillation  Past Medical History:  Diagnosis Date   Atrial flutter/fib 10/27/2011   flecainide::CHADSVASc--1>> no anticoag 2/14   Atrial tachycardia (HCC)    Bulging disc    cervical, no limitations   History of shingles    Nephrolithiasis    PVC (premature ventricular contraction)    asymptomatic   Syncope    Neurally mediated-presumed::also ?assoc with AFib   Vertical nystagmus    no recent issues   Wears dentures    full upper, partial lower    Past Surgical History:  Procedure Laterality Date   CARDIAC ELECTROPHYSIOLOGY STUDY AND ABLATION  2015, 2016   COLONOSCOPY WITH PROPOFOL N/A 10/03/2016   Procedure: COLONOSCOPY WITH PROPOFOL;  Surgeon: Midge Minium, MD;  Location: [facility];  Service: Endoscopy;  Laterality: N/A;   EXTRACORPOREAL SHOCK WAVE LITHOTRIPSY Left 02/09/2017   Procedure: EXTRACORPOREAL SHOCK WAVE LITHOTRIPSY (ESWL);  Surgeon: Vanna Scotland, MD;  Location: [facility] ORS;  Service: Urology;  Laterality: Left;  HERNIA REPAIR     bilateral   NEPHROSTOMY     open, left kidney stone   SQUAMOUS CELL CARCINOMA EXCISION     Head     Home Medications:  Prior to Admission medications   Medication Sig Start Date End Date Taking? Authorizing Provider  Multiple Vitamin (MULTIVITAMIN) tablet Take 1 tablet by mouth daily.   Yes [provider]  vitamin C (ASCORBIC ACID) 500 MG tablet Take 500 mg by mouth daily.   Yes [provider]  VITAMIN D PO Take by mouth daily.   Yes [provider]  Multiple Vitamins-Minerals (ZINC PO) Take by mouth daily.    [provider]    Inpatient Medications: Scheduled Meds:  apixaban  5 mg Oral BID   metoprolol tartrate  12.5 mg Oral BID   Continuous Infusions:  PRN Meds: acetaminophen,  ondansetron (ZOFRAN) IV  Allergies:   No Known Allergies  Social History:   Social History   Socioeconomic History   Marital status: Married    Spouse name: Not on file   Number of children: Not on file   Years of education: Not on file   Highest education level: Not on file  Occupational History   Occupation: Minister  Tobacco Use   Smoking status: Never   Smokeless tobacco: Never  Vaping Use   Vaping status: Never Used  Substance and Sexual Activity   Alcohol use: No   Drug use: No   Sexual activity: Not on file  Other Topics Concern   Not on file  Social History Narrative   Not on file   Social Drivers of Health   Financial Resource Strain: Low Risk  (12/09/2023)   Received from [facility]   Overall Financial Resource Strain (CARDIA)    Difficulty of Paying Living Expenses: Not hard at all  Food Insecurity: No Food Insecurity (12/09/2023)   Received from [facility]   Hunger Vital Sign    Worried About Running Out of Food in the Last Year: Never true    Ran Out of Food in the Last Year: Never true  Transportation Needs: No Transportation Needs (12/09/2023)   Received from [facility]   PRAPARE - Transportation    In the past 12 months, has lack of transportation kept you from medical appointments or from getting medications?: No    Lack of Transportation (Non-Medical): No  Physical Activity: Not on file  Stress: Not on file  Social Connections: Not on file  Intimate Partner Violence: Not on file    Family History:    Family History  Problem Relation Age of Onset   Heart failure Mother      ROS:  Please see the history of present illness.  Review of Systems  Constitutional:  Positive for malaise/fatigue.  Cardiovascular:  Positive for palpitations.  Gastrointestinal:  Positive for diarrhea, nausea and vomiting.  Musculoskeletal:  Positive for falls.  Neurological:  Positive for loss of consciousness  and weakness.    All other ROS reviewed and negative.     Physical Exam/Data:   Vitals:   01/14/24 0430 01/14/24 0500 01/14/24 0530 01/14/24 0700  BP: (!) 79/62 (!) 76/61 (!) 75/57 (!) 86/62  Pulse: 98 73 73 74  Resp: (!) 21 19 18 19   Temp: 98 F (36.7 C)     TempSrc:      SpO2: 92% 91% 93% 93%  Weight:      Height:  Intake/Output Summary (Last 24 hours) at 01/14/2024 0842 Last data filed at 01/14/2024 0554 Gross per 24 hour  Intake 1549 ml  Output --  Net 1549 ml      01/13/2024   11:34 AM 10/31/2023    4:12 PM 01/28/2021   10:29 AM  Last 3 Weights  Weight (lbs) 195 lb 201 lb 8 oz 189 lb  Weight (kg) 88.451 kg 91.4 kg 85.73 kg     Body mass index is 26.45 kg/m.  General:  Well nourished, well developed, in no acute distress HEENT: normal Neck: no JVD Vascular: No carotid bruits; Distal pulses 2+ bilaterally Cardiac:  normal S1, S2; RRR; no murmur  Lungs:  clear to auscultation bilaterally, no wheezing, rhonchi or rales  Abd: soft, nontender, no hepatomegaly  Ext: no edema Musculoskeletal:  No deformities, BUE and BLE strength normal and equal Skin: warm and dry  Neuro:  CNs 2-12 intact, no focal abnormalities noted Psych:  Normal affect   EKG:  The EKG was personally reviewed and demonstrates: Supraventricular tachycardia with rate 157, left anterior fascicular block, LVH, and repolarization abnormality Telemetry:  Telemetry was personally reviewed and demonstrates:  sinus with rates in the 70's  Relevant CV Studies: cMRI 03/05/2021 IMPRESSION: Normal LV and RV function without significant late gadolinium enhancement.   Study not suggestive of hypertrophic cardiomyopathy  Lexiscan MPI 01/04/2021 Blood pressure demonstrated a normal response to exercise. There was no ST segment deviation noted during stress. Excellent exercise capacity with exercise duration of 9 minutes and 30 seconds. T wave inversion was noted during stress in the aVR and aVL leads.  T wave inversion persisted. Persistent throughout study The study is normal. This is a low risk study. The left ventricular ejection fraction is normal (55-65%). CT attenuation images show mild coronary and aortic calcifications.  2D echo 12/29/2020 1. Left ventricular ejection fraction, by estimation, is 60 to 65%. The  left ventricle has normal function. The left ventricle has no regional  wall motion abnormalities. There is mild asymmetric left ventricular  hypertrophy of the septal segment. Left  ventricular diastolic parameters are indeterminate.   2. Right ventricular systolic function is normal. The right ventricular  size is normal. There is mildly elevated pulmonary artery systolic  pressure. The estimated right ventricular systolic pressure is 41.7 mmHg.   3. The mitral valve is normal in structure. Mild to moderate mitral valve  regurgitation. No evidence of mitral stenosis.   4. The aortic valve has an indeterminant number of cusps. Aortic valve  regurgitation is not visualized. Mild to moderate aortic valve  sclerosis/calcification is present, without any evidence of aortic  stenosis.   5. The inferior vena cava is normal in size with greater than 50%  respiratory variability, suggesting right atrial pressure of 3 mmHg.   Laboratory Data:  High Sensitivity Troponin:   Recent Labs  Lab 01/13/24 1129 01/13/24 1324  TROPONINIHS 15 19*     Chemistry Recent Labs  Lab 01/13/24 1129 01/13/24 1133  NA 139  --   K 4.1  --   CL 108  --   CO2 22  --   GLUCOSE 150*  --   BUN 37*  --   CREATININE 1.31*  --   CALCIUM 8.3*  --   MG  --  1.8  GFRNONAA 56*  --   ANIONGAP 9  --     Recent Labs  Lab 01/13/24 1133  PROT 7.1  ALBUMIN 3.7  AST 26  ALT  15  ALKPHOS 68  BILITOT 1.2   Lipids No results for input(s): "CHOL", "TRIG", "HDL", "LABVLDL", "LDLCALC", "CHOLHDL" in the last 168 hours.  Hematology Recent Labs  Lab 01/13/24 1129  WBC 12.7*  RBC 4.35  HGB  12.9*  HCT 40.2  MCV 92.4  MCH 29.7  MCHC 32.1  RDW 13.3  PLT 203   Thyroid No results for input(s): "TSH", "FREET4" in the last 168 hours.  BNP Recent Labs  Lab 01/13/24 1133  BNP 275.2*    DDimer No results for input(s): "DDIMER" in the last 168 hours.   Radiology/Studies:  CT Head Wo Contrast Result Date: 01/13/2024 CLINICAL DATA:  Head trauma, minor (Age >= 65y); Neck trauma (Age >= 65y). Two syncopal episodes. Head strike. Nausea and vomiting. EXAM: CT HEAD WITHOUT CONTRAST CT CERVICAL SPINE WITHOUT CONTRAST TECHNIQUE: Multidetector CT imaging of the head and cervical spine was performed following the standard protocol without intravenous contrast. Multiplanar CT image reconstructions of the cervical spine were also generated. RADIATION DOSE REDUCTION: This exam was performed according to the departmental dose-optimization program which includes automated exposure control, adjustment of the mA and/or kV according to patient size and/or use of iterative reconstruction technique. COMPARISON:  CT head and cervical spine 07/31/2012 FINDINGS: CT HEAD FINDINGS Brain: There is no evidence of an acute infarct, intracranial hemorrhage, mass, midline shift, or extra-axial fluid collection. Cerebral volume is within normal limits for age. The ventricles are normal in size. Cerebral white matter hypodensities are nonspecific but compatible with mild chronic small vessel ischemic disease. Vascular: Calcified atherosclerosis at the skull base. No hyperdense vessel. Skull: No acute fracture or suspicious lesion. Sinuses/Orbits: Visualized paranasal sinuses and mastoid air cells are clear. Bilateral cataract extraction. Other: None. CT CERVICAL SPINE FINDINGS Alignment: Straightening of the normal cervical lordosis. Chronic trace anterolisthesis of C7 on T1. Skull base and vertebrae: No acute fracture or suspicious lesion. Soft tissues and spinal canal: No prevertebral fluid or swelling. No visible canal  hematoma. Disc levels: Diffuse cervical disc degeneration, greatest at C5-6 and C6-7 with at least mild spinal stenosis and moderate neural foraminal stenosis at both levels. Mild-to-moderate multilevel facet arthrosis. Upper chest: The included lung apices are clear. Other: Mild atherosclerotic calcification at the carotid bifurcations. IMPRESSION: 1. No evidence of acute intracranial abnormality or cervical spine fracture. 2. Mild chronic small vessel ischemic disease. Electronically Signed   By: Sebastian Ache M.D.   On: 01/13/2024 12:45   CT Cervical Spine Wo Contrast Result Date: 01/13/2024 CLINICAL DATA:  Head trauma, minor (Age >= 65y); Neck trauma (Age >= 65y). Two syncopal episodes. Head strike. Nausea and vomiting. EXAM: CT HEAD WITHOUT CONTRAST CT CERVICAL SPINE WITHOUT CONTRAST TECHNIQUE: Multidetector CT imaging of the head and cervical spine was performed following the standard protocol without intravenous contrast. Multiplanar CT image reconstructions of the cervical spine were also generated. RADIATION DOSE REDUCTION: This exam was performed according to the departmental dose-optimization program which includes automated exposure control, adjustment of the mA and/or kV according to patient size and/or use of iterative reconstruction technique. COMPARISON:  CT head and cervical spine 07/31/2012 FINDINGS: CT HEAD FINDINGS Brain: There is no evidence of an acute infarct, intracranial hemorrhage, mass, midline shift, or extra-axial fluid collection. Cerebral volume is within normal limits for age. The ventricles are normal in size. Cerebral white matter hypodensities are nonspecific but compatible with mild chronic small vessel ischemic disease. Vascular: Calcified atherosclerosis at the skull base. No hyperdense vessel. Skull: No acute fracture or suspicious  lesion. Sinuses/Orbits: Visualized paranasal sinuses and mastoid air cells are clear. Bilateral cataract extraction. Other: None. CT CERVICAL  SPINE FINDINGS Alignment: Straightening of the normal cervical lordosis. Chronic trace anterolisthesis of C7 on T1. Skull base and vertebrae: No acute fracture or suspicious lesion. Soft tissues and spinal canal: No prevertebral fluid or swelling. No visible canal hematoma. Disc levels: Diffuse cervical disc degeneration, greatest at C5-6 and C6-7 with at least mild spinal stenosis and moderate neural foraminal stenosis at both levels. Mild-to-moderate multilevel facet arthrosis. Upper chest: The included lung apices are clear. Other: Mild atherosclerotic calcification at the carotid bifurcations. IMPRESSION: 1. No evidence of acute intracranial abnormality or cervical spine fracture. 2. Mild chronic small vessel ischemic disease. Electronically Signed   By: Sebastian Ache M.D.   On: 01/13/2024 12:45   DG Chest Port 1 View Result Date: 01/13/2024 CLINICAL DATA:  Chest pain EXAM: PORTABLE CHEST 1 VIEW COMPARISON:  07/31/2012 FINDINGS: The heart size and mediastinal contours are within normal limits. Aortic atherosclerosis. No focal airspace consolidation, pleural effusion, or pneumothorax. The visualized skeletal structures are unremarkable. IMPRESSION: No active disease. Electronically Signed   By: Duanne Guess D.O.   On: 01/13/2024 12:24     Assessment and Plan:   Syncope with collapse -History of syncope and vasovagal syncope in the past -Presented after 2 separate episodes of syncope with collapse -IV fluids given -Echocardiogram ordered and pending with further recommendations to follow -Noted to be in SVT by EMS and was given adenosine 6 mg and 12 mg -Found to be in A-fib on arrival to the ER -Continue with telemetry monitoring -CT of the head and cervical spine negative for any acute findings -ZIO AT monitoring on DC, unless other decisions are made by EP -Advised patient that as per [place] [employer] that with syncope he is not to drive for 6 months or until the cause is  found and treated  Atrial fibrillation with RVR -Previous history of atrial fibrillation status post ablation in January 2015 and August 2015 -After adenosine was noted to be in SVT and in atrial fibrillation RVR -Treated with IV diltiazem 10 mg IVP -Started on metoprolol 12.5 mg twice daily but had hypotension which required IV fluid boluses and midodrine one-time dosing overnight for systolic blood pressures in the 60s -Continued on apixaban 5 mg twice daily for CHA2DS2-VASc score of at least 2 for stroke prophylaxis -Converted to sinus around 4:10 this AM -Continue on telemetry monitoring -Will consult EP on Monday for further recommendations  Chronic kidney disease -Serum creatinine 1.31 -Baseline serum creatinine of 1-1.2 -Has received gentle hydration -Monitor urine output -Daily BMP -Avoid nephrotoxic agents were able  Hypotension -blood pressure 85/60 -continues to remains soft since yesterday -metoprolol has been held due to hypotension -vital signs per un it protocol   Risk Assessment/Risk Scores:         CHA2DS2-VASc Score = 2   This indicates a 2.2% annual risk of stroke. The patient's score is based upon: CHF History: 0 HTN History: 0 Diabetes History: 0 Stroke History: 0 Vascular Disease History: 0 Age Score: 2 Gender Score: 0         For questions or updates, please contact [facility] Please consult [email or web address] for contact info under    Signed, Christropher Gintz, NP  01/14/2024 8:42 AM

## 2024-01-14 NOTE — ED Notes (Addendum)
 Walked pt down the hall, HR got up to 162 walking, was sitting around the 150's just getting out of bed. Pt stayed NSR during this walk. O2 stayed in the upper 90's on RA. BP was 125/84 walking as well. Pt stating feeling a little winded upon returning to bed. Dr. Cristal Deer made aware.

## 2024-01-14 NOTE — Progress Notes (Signed)
       CROSS COVER NOTE  NAME: Billy Carr MRN: [id number] DOB : 10-Apr-1946 ATTENDING PHYSICIAN: Verdene Lennert, MD    Date of Service   01/14/2024   HPI/Events of Note   Message received from nurse  "Good morning, This patient is being admitted for A-fib RVR. Dr. Huel Cote had previously ordered metoprolol 12.5 BID, for HR control. Earlier she told the previous nurse to give the medication with a BP of 95/62. Pt's BP was 98/69 so I gave the second dose. His BP is now 66/37. His BNP was 75. Do you want me to start fluids? "  Interventions   Assessment/Plan:    01/13/2024   11:15 PM 01/13/2024   10:45 PM 01/13/2024   10:30 PM  Vitals with BMI  Systolic 91 84 98  Diastolic 57 56 69  Pulse 61 74 39    250 ml LR bolus x 2 Midodrine 20 mg oral x1       Donnie Mesa NP [facility] Cross Cover 7pm-7am - check amion for availability Pager [phone]

## 2024-01-14 NOTE — Care Management Obs Status (Signed)
 MEDICARE OBSERVATION STATUS NOTIFICATION   Patient Details  Name: CASHEL BELLINA MRN: [id number] Date of Birth: [birth year]   Medicare Observation Status Notification Given:  Yes    Susa Simmonds, LCSWA 01/14/2024, 1:32 PM

## 2024-01-14 NOTE — ED Notes (Signed)
 0602 Pt converted to NSR from A-fib. EKG captured and hospitalist [facility] notified.

## 2024-01-14 NOTE — Progress Notes (Signed)
  Echocardiogram 2D Echocardiogram has been performed.  Billy Carr 01/14/2024, 6:18 PM

## 2024-01-15 ENCOUNTER — Encounter: Payer: Self-pay | Admitting: Internal Medicine

## 2024-01-15 DIAGNOSIS — N289 Disorder of kidney and ureter, unspecified: Secondary | ICD-10-CM | POA: Diagnosis not present

## 2024-01-15 DIAGNOSIS — I471 Supraventricular tachycardia, unspecified: Secondary | ICD-10-CM | POA: Diagnosis not present

## 2024-01-15 DIAGNOSIS — R55 Syncope and collapse: Secondary | ICD-10-CM | POA: Diagnosis not present

## 2024-01-15 DIAGNOSIS — I4891 Unspecified atrial fibrillation: Secondary | ICD-10-CM | POA: Diagnosis not present

## 2024-01-15 DIAGNOSIS — I4892 Unspecified atrial flutter: Secondary | ICD-10-CM

## 2024-01-15 LAB — ECHOCARDIOGRAM COMPLETE
AR max vel: 2.13 cm2
AV Peak grad: 11.8 mmHg
Ao pk vel: 1.72 m/s
Area-P 1/2: 6.22 cm2
Calc EF: 53.8 %
Height: 72 in
S' Lateral: 3.6 cm
Single Plane A2C EF: 55.1 %
Single Plane A4C EF: 52.4 %
Weight: 3120 [oz_av]

## 2024-01-15 LAB — BASIC METABOLIC PANEL WITH GFR
Anion gap: 10 (ref 5–15)
BUN: 24 mg/dL — ABNORMAL HIGH (ref 8–23)
CO2: 22 mmol/L (ref 22–32)
Calcium: 7.7 mg/dL — ABNORMAL LOW (ref 8.9–10.3)
Chloride: 107 mmol/L (ref 98–111)
Creatinine, Ser: 0.9 mg/dL (ref 0.61–1.24)
GFR, Estimated: >60 mL/min (ref >60)
Glucose, Bld: 97 mg/dL (ref 70–99)
Potassium: 3.9 mmol/L (ref 3.5–5.1)
Sodium: 139 mmol/L (ref 135–145)

## 2024-01-15 LAB — TSH: TSH: 0.899 u[IU]/mL (ref 0.350–4.500)

## 2024-01-15 NOTE — Progress Notes (Signed)
 [facility]  - [place] at [facility]   PATIENT NAME: Billy Carr    MR#:  [id number]  DATE OF BIRTH:  May 18, 1946  SUBJECTIVE:  family at bedside in the ER. Patient overall feels better. Remains in sinus rhythm on the monitor sats stable. Denies chest pain. HR up with activity and goes into afib  VITALS:  Blood pressure 108/65, pulse (!) 53, temperature 98.2 F (36.8 C), temperature source Oral, resp. rate 20, height 6' (1.829 m), weight 88.5 kg, SpO2 94%.  PHYSICAL EXAMINATION:   GENERAL:  78 y.o.-year-old patient with no acute distress.  LUNGS: Normal breath sounds bilaterally, no wheezing CARDIOVASCULAR: S1, S2 normal. No murmur   ABDOMEN: Soft, nontender, nondistended. EXTREMITIES: No  edema b/l.    NEUROLOGIC: nonfocal  patient is alert and awake  LABORATORY PANEL:  CBC Recent Labs  Lab 01/13/24 1129  WBC 12.7*  HGB 12.9*  HCT 40.2  PLT 203    Chemistries  Recent Labs  Lab 01/13/24 1133 01/15/24 0507  NA  --  139  K  --  3.9  CL  --  107  CO2  --  22  GLUCOSE  --  97  BUN  --  24*  CREATININE  --  0.90  CALCIUM  --  7.7*  MG 1.8  --   AST 26  --   ALT 15  --   ALKPHOS 68  --   BILITOT 1.2  --    Cardiac Enzymes No results for input(s): "TROPONINI" in the last 168 hours. RADIOLOGY:  No results found.   Assessment and Plan  DELFIN Carr is a 78 y.o. male with medical history significant of paroxysmal atrial fibrillation s/p ablation (2015, 2016), SVT, prediabetes, CKD stage IIIa, who presents to the ED due to syncope x 2 On EMS arrival, patient was noted to be in SVTs with rates as high as 150-160. Patient received IV diltiazem times one and PO beta-blockers. His blood pressure went down in the 60s. Patient normally has low blood pressure at baseline. He was given a dose of midodrine and IV fluids.  Syncope Patient is presenting with 2 separate episodes of syncope with no prodrome concerning for cardiogenic etiology.  On EMS  arrival, he was in SVT with rates as high as 160.  No once rates were slowed, demonstrated underlying rhythm of atrial fibrillation with RVR.  After examination, rates were persistently ventricular bigeminy.  - [facility] consulted--EP to see on monday - Echocardiogram pending results - S/p diltiazem 10 mg once - Start metoprolol 12.5 mg twice daily for PVC suppression and rate control--now held due to soft BP - patient currently remains in sinus rhythm however heart rate goes up in the 150s with ambulation in the ER. --3/31-- patient was evaluated by EP. Plans for TEE and DCCV tomorrow   Atrial fibrillation with RVR (HCC) --Previous history of atrial fibrillation s/p ablation in 2015 and 2016 with no reoccurrence.  After receiving diltiazem, EKG demonstrated atrial fibrillation with RVR.   -- CHA2DS2-VASc of 2 given age.  - Now on Eliquis 5 mg twice daily --Toprol XL 12.5 mg qd   Renal insufficiency Per chart review, patient has a history of renal insufficiency with creatinine ranging between 1 and 1.2.  Currently 1.3, not meeting criteria for AKI.  - Recieved  IV fluids --creat normalized   Procedures: Family communication : wife and son Consults : [facility] cardiology CODE STATUS: full DVT Prophylaxis : eliquis Level  of care: Telemetry Cardiac Status [id number]-- TEE/DCCV in am per EP recommendations    TOTAL TIME TAKING CARE OF THIS PATIENT: 45 minutes.  >50% time spent on counselling and coordination of care  Note: This dictation was prepared with Dragon dictation along with smaller phrase technology. Any transcriptional errors that result from this process are unintentional.  Enedina Finner M.D    [facility]   CC: Primary care physician; Marisue Ivan, MD

## 2024-01-15 NOTE — Plan of Care (Signed)

## 2024-01-15 NOTE — Consult Note (Signed)
 ELECTROPHYSIOLOGY CONSULT NOTE    Patient ID: POLK MINOR MRN: [id number], DOB/AGE: 1946/04/04 78 y.o.  Admit date: 01/13/2024 Date of Consult: 01/15/2024  Primary Physician: Marisue Ivan, MD Primary Cardiologist: Lorine Bears, MD  Electrophysiologist: Dr. Graciela Husbands remotely  Referring Provider: Dr. Allena Katz  Patient Profile: Billy Carr is a 78 y.o. male with a history of parox AFib, atypical atrial flutter, Atach, orthostatic intolerance, syncope who is being seen today for the evaluation of syncope at the request of Dr. Allena Katz.  HPI:  Billy Carr is a 78 y.o. male with PMH as above. He is s/p AF ablation 10/2013 and 05/2014 at [facility]. He was loaded on tikosyn in 10/2013, and stopped 12/2014 d/t no AFib.  He was remotely referred to [facility] EP for syncope evaluation with history of NSVT on monitor, cMRI at that time (2022) showed mild RV enlargement, no significant RV or LV dysfunction, no concern for HCM.  He presented to [facility] 3/29 after 2 episodes of syncope.  The first episode happened the evening prior after eating dinner, had nausea with vomiting and diarrhea. He thought the syncope was related to his N/V/D and so went to bed. He woke the morning of 3/29, took a shower, and had second episode of sycnope while walking to closet. EMS was called at that time, was found to be in SVT unresponsive to 6mg , and then 12mg  adenosine.   On further discussion, he does report intermittent palpitations for the last couple months. He is not on [facility], states he had been on 81mg  aspirin for several years, but was told to stop it.   Initial labs notable for Cr up to 1.31, trops flat (15> 19), BNP elevated to 275.   While in ER, his BPs have been borderline 90-110s systolic. General cardiology started 12.5mg  toprol and 5mg  eliquis. Update TTE is pending read. He has had no further episodes of N/V/D, denies chest pain, chest pressure, increased edema or reduced appetite. He does endorse  mild chest palpitations. D   Labs Potassium3.9 (03/31 0507) Magnesium  1.8 (03/29 1133) Creatinine, ser  0.90 (03/31 0507) PLT  203 (03/29 1129) HGB  12.9* (03/29 1129) WBC 12.7* (03/29 1129) Troponin I (High Sensitivity)19* (03/29 1324).    Past Medical History:  Diagnosis Date   Atrial flutter/fib 10/27/2011   flecainide::CHADSVASc--1>> no anticoag 2/14   Atrial tachycardia (HCC)    Bulging disc    cervical, no limitations   History of shingles    Nephrolithiasis    PVC (premature ventricular contraction)    asymptomatic   Syncope    Neurally mediated-presumed::also ?assoc with AFib   Vertical nystagmus    no recent issues   Wears dentures    full upper, partial lower     Surgical History:  Past Surgical History:  Procedure Laterality Date   CARDIAC ELECTROPHYSIOLOGY STUDY AND ABLATION  2015, 2016   COLONOSCOPY WITH PROPOFOL N/A 10/03/2016   Procedure: COLONOSCOPY WITH PROPOFOL;  Surgeon: Midge Minium, MD;  Location: [facility];  Service: Endoscopy;  Laterality: N/A;   EXTRACORPOREAL SHOCK WAVE LITHOTRIPSY Left 02/09/2017   Procedure: EXTRACORPOREAL SHOCK WAVE LITHOTRIPSY (ESWL);  Surgeon: Vanna Scotland, MD;  Location: [facility] ORS;  Service: Urology;  Laterality: Left;   HERNIA REPAIR     bilateral   NEPHROSTOMY     open, left kidney stone   SQUAMOUS CELL CARCINOMA EXCISION     Head     (Not in a hospital admission)   Inpatient Medications:  apixaban  5 mg Oral BID   ascorbic acid  500 mg Oral Daily   metoprolol succinate  12.5 mg Oral Daily   multivitamin with minerals  1 tablet Oral Daily    Allergies: No Known Allergies  Family History  Problem Relation Age of Onset   Heart failure Mother      Physical Exam: Vitals:   01/15/24 0530 01/15/24 0600 01/15/24 0617 01/15/24 0830  BP: (!) 80/55 (!) 74/49 103/70 119/73  Pulse: (!) 51 (!) 59 74 (!) 56  Resp: 18 (!) 21 17 20   Temp:      TempSrc:      SpO2: 95% 95% 95% 95%  Weight:      Height:         GEN- NAD, A&O x 3, normal affect HEENT: Normocephalic, atraumatic Lungs- CTAB, Normal effort.  Heart- irregular rate and rhythm, No M/G/R.  GI- Soft, NT, ND.  Extremities- No clubbing, cyanosis, or edema   Radiology/Studies: CT Head Wo Contrast Result Date: 01/13/2024 CLINICAL DATA:  Head trauma, minor (Age >= 65y); Neck trauma (Age >= 65y). Two syncopal episodes. Head strike. Nausea and vomiting. EXAM: CT HEAD WITHOUT CONTRAST CT CERVICAL SPINE WITHOUT CONTRAST TECHNIQUE: Multidetector CT imaging of the head and cervical spine was performed following the standard protocol without intravenous contrast. Multiplanar CT image reconstructions of the cervical spine were also generated. RADIATION DOSE REDUCTION: This exam was performed according to the departmental dose-optimization program which includes automated exposure control, adjustment of the mA and/or kV according to patient size and/or use of iterative reconstruction technique. COMPARISON:  CT head and cervical spine 07/31/2012 FINDINGS: CT HEAD FINDINGS Brain: There is no evidence of an acute infarct, intracranial hemorrhage, mass, midline shift, or extra-axial fluid collection. Cerebral volume is within normal limits for age. The ventricles are normal in size. Cerebral white matter hypodensities are nonspecific but compatible with mild chronic small vessel ischemic disease. Vascular: Calcified atherosclerosis at the skull base. No hyperdense vessel. Skull: No acute fracture or suspicious lesion. Sinuses/Orbits: Visualized paranasal sinuses and mastoid air cells are clear. Bilateral cataract extraction. Other: None. CT CERVICAL SPINE FINDINGS Alignment: Straightening of the normal cervical lordosis. Chronic trace anterolisthesis of C7 on T1. Skull base and vertebrae: No acute fracture or suspicious lesion. Soft tissues and spinal canal: No prevertebral fluid or swelling. No visible canal hematoma. Disc levels: Diffuse cervical disc  degeneration, greatest at C5-6 and C6-7 with at least mild spinal stenosis and moderate neural foraminal stenosis at both levels. Mild-to-moderate multilevel facet arthrosis. Upper chest: The included lung apices are clear. Other: Mild atherosclerotic calcification at the carotid bifurcations. IMPRESSION: 1. No evidence of acute intracranial abnormality or cervical spine fracture. 2. Mild chronic small vessel ischemic disease. Electronically Signed   By: Sebastian Ache M.D.   On: 01/13/2024 12:45   CT Cervical Spine Wo Contrast Result Date: 01/13/2024 CLINICAL DATA:  Head trauma, minor (Age >= 65y); Neck trauma (Age >= 65y). Two syncopal episodes. Head strike. Nausea and vomiting. EXAM: CT HEAD WITHOUT CONTRAST CT CERVICAL SPINE WITHOUT CONTRAST TECHNIQUE: Multidetector CT imaging of the head and cervical spine was performed following the standard protocol without intravenous contrast. Multiplanar CT image reconstructions of the cervical spine were also generated. RADIATION DOSE REDUCTION: This exam was performed according to the departmental dose-optimization program which includes automated exposure control, adjustment of the mA and/or kV according to patient size and/or use of iterative reconstruction technique. COMPARISON:  CT head and cervical spine 07/31/2012 FINDINGS: CT HEAD  FINDINGS Brain: There is no evidence of an acute infarct, intracranial hemorrhage, mass, midline shift, or extra-axial fluid collection. Cerebral volume is within normal limits for age. The ventricles are normal in size. Cerebral white matter hypodensities are nonspecific but compatible with mild chronic small vessel ischemic disease. Vascular: Calcified atherosclerosis at the skull base. No hyperdense vessel. Skull: No acute fracture or suspicious lesion. Sinuses/Orbits: Visualized paranasal sinuses and mastoid air cells are clear. Bilateral cataract extraction. Other: None. CT CERVICAL SPINE FINDINGS Alignment: Straightening of the  normal cervical lordosis. Chronic trace anterolisthesis of C7 on T1. Skull base and vertebrae: No acute fracture or suspicious lesion. Soft tissues and spinal canal: No prevertebral fluid or swelling. No visible canal hematoma. Disc levels: Diffuse cervical disc degeneration, greatest at C5-6 and C6-7 with at least mild spinal stenosis and moderate neural foraminal stenosis at both levels. Mild-to-moderate multilevel facet arthrosis. Upper chest: The included lung apices are clear. Other: Mild atherosclerotic calcification at the carotid bifurcations. IMPRESSION: 1. No evidence of acute intracranial abnormality or cervical spine fracture. 2. Mild chronic small vessel ischemic disease. Electronically Signed   By: Sebastian Ache M.D.   On: 01/13/2024 12:45   DG Chest Port 1 View Result Date: 01/13/2024 CLINICAL DATA:  Chest pain EXAM: PORTABLE CHEST 1 VIEW COMPARISON:  07/31/2012 FINDINGS: The heart size and mediastinal contours are within normal limits. Aortic atherosclerosis. No focal airspace consolidation, pleural effusion, or pneumothorax. The visualized skeletal structures are unremarkable. IMPRESSION: No active disease. Electronically Signed   By: Duanne Guess D.O.   On: 01/13/2024 12:24    EKG: 01/14/2024 1338 - appears atrial flutter at 113bpm, incomplete RBBB, rate PVC 01/14/24 - 607 - sinus with 1st deg HB at 75; PR 206; incomplete RBBB, LAD   (personally reviewed)  TELEMETRY: atrial flutter with variable 2:1 (personally reviewed)   Assessment/Plan: #) syncope #) atrial flutter #) atrial fibrillation #) ?vagally mediated syncope S/p ablation x 2 in 2015 at [facility], remotely on tikosyn Presents to [facility] ER after syncope x 2, the first episode happening with nausea/vomiting/diarrhea, the second episode happening after showering Unclear whether syncope is vagally mediated with atrial flutter Updated TTE pending read Continue 5mg  eliquis BID for stroke ppx Appear to be tolerating 12.5mg   toprol with borderline BP, unable to increase BB  We discussed proceeding with TEE/DCCV , patient verbalized understanding and wished to proceed NPO at midnight    Informed Consent   Shared Decision Making/Informed Consent   The risks [stroke, cardiac arrhythmias rarely resulting in the need for a temporary or permanent pacemaker, skin irritation or burns, esophageal damage, perforation (1:10,000 risk), bleeding, pharyngeal hematoma as well as other potential complications associated with conscious sedation including aspiration, arrhythmia, respiratory failure and death], benefits (treatment guidance, restoration of normal sinus rhythm, diagnostic support) and alternatives of a transesophageal echocardiogram guided cardioversion were discussed in detail with Mr. Magos and he is willing to proceed.             For questions or updates, please contact [facility] Please consult [email or web address] for contact info under Cardiology/STEMI.  Signed, Sherie Don, NP  01/15/2024 9:29 AM

## 2024-01-16 ENCOUNTER — Encounter
Admission: EM | Disposition: A | Discharge status: Home / Self Care | Payer: Self-pay | Source: Home / Self Care | Attending: Internal Medicine

## 2024-01-16 ENCOUNTER — Inpatient Hospital Stay
Admit: 2024-01-16 | Discharge: 2024-01-16 | Disposition: A | Discharge status: Home / Self Care | Attending: Cardiology | Admitting: Cardiology

## 2024-01-16 ENCOUNTER — Other Ambulatory Visit (HOSPITAL_COMMUNITY): Payer: Self-pay

## 2024-01-16 ENCOUNTER — Inpatient Hospital Stay: Admitting: Certified Registered"

## 2024-01-16 ENCOUNTER — Encounter: Payer: Self-pay | Admitting: Internal Medicine

## 2024-01-16 DIAGNOSIS — R55 Syncope and collapse: Secondary | ICD-10-CM | POA: Diagnosis not present

## 2024-01-16 DIAGNOSIS — I484 Atypical atrial flutter: Secondary | ICD-10-CM | POA: Diagnosis not present

## 2024-01-16 DIAGNOSIS — I4892 Unspecified atrial flutter: Secondary | ICD-10-CM

## 2024-01-16 DIAGNOSIS — I340 Nonrheumatic mitral (valve) insufficiency: Secondary | ICD-10-CM

## 2024-01-16 DIAGNOSIS — I361 Nonrheumatic tricuspid (valve) insufficiency: Secondary | ICD-10-CM

## 2024-01-16 DIAGNOSIS — I4891 Unspecified atrial fibrillation: Secondary | ICD-10-CM | POA: Diagnosis not present

## 2024-01-16 HISTORY — PX: CARDIOVERSION: SHX1299

## 2024-01-16 HISTORY — PX: TEE WITHOUT CARDIOVERSION: SHX5443

## 2024-01-16 LAB — ECHO TEE

## 2024-01-16 SURGERY — ECHOCARDIOGRAM, TRANSESOPHAGEAL
Anesthesia: General

## 2024-01-16 MED ORDER — LIDOCAINE VISCOUS HCL 2 % MT SOLN
15.0000 mL | Freq: Once | OROMUCOSAL | Status: AC
  Administered 2024-01-16: 15 mL via OROMUCOSAL
  Filled 2024-01-16: qty 15

## 2024-01-16 MED ORDER — SODIUM CHLORIDE 0.9 % IV SOLN: INTRAVENOUS | Status: DC

## 2024-01-16 MED ORDER — PHENYLEPHRINE 80 MCG/ML (10ML) SYRINGE FOR IV PUSH (FOR BLOOD PRESSURE SUPPORT)
PREFILLED_SYRINGE | INTRAVENOUS | Status: DC | PRN
  Administered 2024-01-16: 80 ug via INTRAVENOUS
  Administered 2024-01-16: 160 ug via INTRAVENOUS
  Administered 2024-01-16: 80 ug via INTRAVENOUS

## 2024-01-16 MED ORDER — LIDOCAINE VISCOUS HCL 2 % MT SOLN
OROMUCOSAL | Status: AC
  Filled 2024-01-16: qty 15

## 2024-01-16 MED ORDER — LIDOCAINE HCL (CARDIAC) PF 100 MG/5ML IV SOSY
PREFILLED_SYRINGE | INTRAVENOUS | Status: DC | PRN
Start: 2024-01-16 — End: 2024-01-16
  Administered 2024-01-16: 40 mg via INTRAVENOUS

## 2024-01-16 MED ORDER — PROPOFOL 500 MG/50ML IV EMUL
INTRAVENOUS | Status: DC | PRN
  Administered 2024-01-16: 70 mg via INTRAVENOUS
  Administered 2024-01-16: 20 mg via INTRAVENOUS
  Administered 2024-01-16 (×2): 30 mg via INTRAVENOUS
  Administered 2024-01-16: 20 mg via INTRAVENOUS

## 2024-01-16 MED ORDER — BUTAMBEN-TETRACAINE-BENZOCAINE 2-2-14 % EX AERO
INHALATION_SPRAY | CUTANEOUS | Status: AC
  Filled 2024-01-16: qty 5

## 2024-01-16 NOTE — Plan of Care (Signed)
   Problem: Education: Goal: Knowledge of General Education information will improve Description Including pain rating scale, medication(s)/side effects and non-pharmacologic comfort measures Outcome: Progressing

## 2024-01-16 NOTE — Progress Notes (Signed)
Pt refused bed alarm but was educated about its importance. Will continue to monitor. 

## 2024-01-16 NOTE — Plan of Care (Signed)

## 2024-01-16 NOTE — Anesthesia Postprocedure Evaluation (Signed)
 Anesthesia Post Note  Patient: Billy Carr  Procedure(s) Performed: ECHOCARDIOGRAM, TRANSESOPHAGEAL CARDIOVERSION  Patient location during evaluation: Specials Recovery Anesthesia Type: General Level of consciousness: awake and alert Pain management: pain level controlled Vital Signs Assessment: post-procedure vital signs reviewed and stable Respiratory status: spontaneous breathing, nonlabored ventilation, respiratory function stable and patient connected to nasal cannula oxygen Cardiovascular status: blood pressure returned to baseline and stable Postop Assessment: no apparent nausea or vomiting Anesthetic complications: no   No notable events documented.   Last Vitals:  Vitals:   01/16/24 1339 01/16/24 1400  BP: (!) 85/55 99/80  Pulse: 67 62  Resp: (!) 22 20  Temp:    SpO2: 94% 98%    Last Pain:  Vitals:   01/16/24 1212  TempSrc: Oral  PainSc: 0-No pain                 Lenard Simmer

## 2024-01-16 NOTE — Transfer of Care (Signed)
 Immediate Anesthesia Transfer of Care Note  Patient: Billy Carr  Procedure(s) Performed: ECHOCARDIOGRAM, TRANSESOPHAGEAL CARDIOVERSION  Patient Location: PACU  Anesthesia Type:General  Level of Consciousness: drowsy and patient cooperative  Airway & Oxygen Therapy: Patient Spontanous Breathing and Patient connected to nasal cannula oxygen  Post-op Assessment: Report given to RN, Post -op Vital signs reviewed and stable, and Patient moving all extremities X 4  Post vital signs: Reviewed and stable  Last Vitals:  Vitals Value Taken Time  BP    Temp    Pulse    Resp    SpO2      Last Pain:  Vitals:   01/16/24 1212  TempSrc: Oral  PainSc: 0-No pain         Complications: No notable events documented.

## 2024-01-16 NOTE — TOC Benefit Eligibility Note (Signed)
 Pharmacy Patient Advocate Encounter  Insurance verification completed.    The patient is insured through [employer]/ [employer]. Patient has Medicare and is not eligible for a copay card, but may be able to apply for patient assistance or Medicare RX Payment Plan (Patient Must reach out to their plan, if eligible for payment plan), if available.    Ran test claim for Eliquis and the current 30 day co-pay is $47.   This test claim was processed through [facility]- copay amounts may vary at other pharmacies due to pharmacy/plan contracts, or as the patient moves through the different stages of their insurance plan.

## 2024-01-16 NOTE — Progress Notes (Addendum)
  Patient Name: Billy Carr Date of Encounter: 01/16/2024  Primary Cardiologist: Lorine Bears, MD Electrophysiologist: None  Interval Summary   Transferred from ED late yesterday evening He overall feels well without complaints. No further syncopal episodes. Denies palpitaitons.    Vital Signs    Vitals:   01/15/24 1945 01/15/24 2326 01/16/24 0405 01/16/24 0758  BP: 109/76 112/70 105/74 116/82  Pulse: (!) 53 64 74 78  Resp: 18 18 18 18   Temp: 98.5 F (36.9 C) 98.6 F (37 C) 98.7 F (37.1 C) 98.3 F (36.8 C)  TempSrc:      SpO2: 97% 97% 98% 97%  Weight:      Height:        Intake/Output Summary (Last 24 hours) at 01/16/2024 1058 Last data filed at 01/16/2024 0405 Gross per 24 hour  Intake --  Output 300 ml  Net -300 ml   Filed Weights   01/13/24 1134  Weight: 88.5 kg    Physical Exam    GEN- The patient is well appearing, alert and oriented x 3 today.   Lungs- Clear to ausculation bilaterally, normal work of breathing Cardiac- Regular rate and rhythm, no murmurs, rubs or gallops GI- soft, NT, ND, + BS Extremities- no clubbing or cyanosis. No edema  Telemetry    Do not appreciate p-waves on tele Ventricular rates well-controlled, 70-80s Frequent PVC (personally reviewed)  Hospital Course    Billy Carr is a 78 y.o. male with PMH of parox AFib, atypical atrial flutter, Atach, orthostatic intolerance, syncope admitted for syncope found to be in atrial flutter.   Updated TTE with preserved LVEF at 50-55%, mildly increased RV wall, mod elevated pulm pressure  Assessment & Plan    #) syncope, ?vagally mediated #) atrial flutter #) atrial fibrillation S/p ablation x 2 in 2015 at [facility], remotely on tikosyn Presents to [facility] ER after syncope x 2, the first episode happening with nausea/vomiting/diarrhea, the second episode happening after showering Eliquis initiated at 5mg  BID, tolerating well Ventricular rates better controlled on 12.5mg  toprol  daily EKG today with 2:1 atypical flutter   Planning for TEE/DCCV later today, npo for procedure.         For questions or updates, please contact [facility] Please consult [email or web address] for contact info under Cardiology/STEMI.  Signed, Sherie Don, NP  01/16/2024, 10:58 AM   I have seen, examined the patient, and reviewed the above assessment and plan.    Interval: No acute overnight events.  Rates have slowed after starting metoprolol.  Remains in atrial flutter.  Patient seen in the prep area prior to TEE and cardioversion.  Patient reports feeling relatively well.  No new or acute complaints.  GEN: No acute distress.   Cardiac: Normal rate, regular rhythm. Psych: Normal affect   Assessment and Plan: #.  Atypical atrial flutter: #.  Atrial fibrillation: -TEE guided cardioversion today.  Continue Eliquis 5 mg twice daily for stroke prophylaxis.  He will need to be on this uninterrupted for the next 30 days following his cardioversion.  #.  Syncope: Suspected to be vagally mediated. -Continue telemetry.  Provide usual discharge teaching for syncope precautions and driving restrictions.  Nobie Putnam, MD 01/16/2024 9:05 PM

## 2024-01-16 NOTE — Anesthesia Preprocedure Evaluation (Signed)
 Anesthesia Evaluation  Patient identified by MRN, date of birth, ID band Patient awake    Reviewed: Allergy & Precautions, H&P , NPO status , Patient's Chart, lab work & pertinent test results, reviewed documented beta blocker date and time   History of Anesthesia Complications Negative for: history of anesthetic complications  Airway Mallampati: II  TM Distance: >3 FB Neck ROM: full    Dental  (+) Dental Advidsory Given, Partial Lower, Edentulous Upper, Upper Dentures   Pulmonary neg pulmonary ROS   Pulmonary exam normal breath sounds clear to auscultation       Cardiovascular Exercise Tolerance: Good (-) hypertension(-) angina (-) Past MI and (-) Cardiac Stents + dysrhythmias Atrial Fibrillation (-) Valvular Problems/Murmurs Rhythm:regular Rate:Normal     Neuro/Psych negative neurological ROS  negative psych ROS   GI/Hepatic negative GI ROS, Neg liver ROS,,,  Endo/Other  negative endocrine ROS    Renal/GU negative Renal ROS  negative genitourinary   Musculoskeletal   Abdominal   Peds  Hematology negative hematology ROS (+)   Anesthesia Other Findings Past Medical History: 10/27/2011: Atrial flutter/fib     Comment:  flecainide::CHADSVASc--1>> no anticoag 2/14 No date: Atrial tachycardia (HCC) No date: Bulging disc     Comment:  cervical, no limitations No date: History of shingles No date: Nephrolithiasis No date: PVC (premature ventricular contraction)     Comment:  asymptomatic No date: Syncope     Comment:  Neurally mediated-presumed::also ?assoc with AFib No date: Vertical nystagmus     Comment:  no recent issues No date: Wears dentures     Comment:  full upper, partial lower   Reproductive/Obstetrics negative OB ROS                             Anesthesia Physical Anesthesia Plan  ASA: 2  Anesthesia Plan: General   Post-op Pain Management:    Induction:  Intravenous  PONV Risk Score and Plan: 2 and Propofol infusion, TIVA and Treatment may vary due to age or medical condition  Airway Management Planned: Natural Airway and Nasal Cannula  Additional Equipment:   Intra-op Plan:   Post-operative Plan:   Informed Consent: I have reviewed the patients History and Physical, chart, labs and discussed the procedure including the risks, benefits and alternatives for the proposed anesthesia with the patient or authorized representative who has indicated his/her understanding and acceptance.     Dental Advisory Given  Plan Discussed with: Anesthesiologist, CRNA and Surgeon  Anesthesia Plan Comments:         Anesthesia Quick Evaluation

## 2024-01-16 NOTE — Progress Notes (Signed)
*  PRELIMINARY RESULTS* Echocardiogram Echocardiogram Transesophageal has been performed.  Cristela Blue 01/16/2024, 1:38 PM

## 2024-01-16 NOTE — Plan of Care (Signed)
  Problem: Education: Goal: Knowledge of General Education information will improve Description: Including pain rating scale, medication(s)/side effects and non-pharmacologic comfort measures Outcome: Progressing   Problem: Health Behavior/Discharge Planning: Goal: Ability to manage health-related needs will improve Outcome: Progressing   Problem: Clinical Measurements: Goal: Respiratory complications will improve Outcome: Progressing   Problem: Clinical Measurements: Goal: Cardiovascular complication will be avoided Outcome: Progressing   Problem: Activity: Goal: Risk for activity intolerance will decrease Outcome: Progressing   Problem: Pain Managment: Goal: General experience of comfort will improve and/or be controlled Outcome: Progressing

## 2024-01-16 NOTE — Progress Notes (Signed)
 [facility]  - [place] [facility]   PATIENT NAME: Billy Carr    MR#:  [id number]  DATE OF BIRTH:  01-27-1946  SUBJECTIVE:  family at bedside . Patient overall feels better NPO for TEE/DCCV VITALS:  Blood pressure 116/82, pulse 78, temperature 98.3 F (36.8 C), resp. rate 18, height 6' (1.829 m), weight 88.5 kg, SpO2 97%.  PHYSICAL EXAMINATION:   GENERAL:  78 y.o.-year-old patient with no acute distress.  LUNGS: Normal breath sounds bilaterally, no wheezing CARDIOVASCULAR: S1, S2 normal. No murmur   ABDOMEN: Soft, nontender, nondistended. EXTREMITIES: No  edema b/l.    NEUROLOGIC: nonfocal  patient is alert and awake  LABORATORY PANEL:  CBC Recent Labs  Lab 01/13/24 1129  WBC 12.7*  HGB 12.9*  HCT 40.2  PLT 203    Chemistries  Recent Labs  Lab 01/13/24 1133 01/15/24 0507  NA  --  139  K  --  3.9  CL  --  107  CO2  --  22  GLUCOSE  --  97  BUN  --  24*  CREATININE  --  0.90  CALCIUM  --  7.7*  MG 1.8  --   AST 26  --   ALT 15  --   ALKPHOS 68  --   BILITOT 1.2  --    Cardiac Enzymes No results for input(s): "TROPONINI" in the last 168 hours. RADIOLOGY:  ECHOCARDIOGRAM COMPLETE Result Date: 01/15/2024    ECHOCARDIOGRAM REPORT   Patient Name:   Billy Carr Date of Exam: 01/14/2024 Medical Rec #:  [id number]        Height:       72.0 in Accession #:    [id number]       Weight:       195.0 lb Date of Birth:  08-Dec-1945         BSA:          2.108 m Patient Age:    77 years         BP:           94/70 mmHg Patient Gender: M                HR:           47 bpm. Exam Location:  [facility] Procedure: 2D Echo, Cardiac Doppler and Color Doppler (Both Spectral and Color            Flow Doppler were utilized during procedure). Indications:     Syncope R55                  Atrial Fibrillation I48.91  History:         Patient has prior history of Echocardiogram examinations, most                  recent 12/29/2020.  Sonographer:     Overton Mam RDCS,  FASE Referring Phys:  [id number] Verdene Lennert Diagnosing Phys: Yvonne Kendall MD IMPRESSIONS  1. Left ventricular ejection fraction, by estimation, is 50 to 55%. The left ventricle has low normal function. The left ventricle has no regional wall motion abnormalities. There is mild left ventricular hypertrophy. Left ventricular diastolic function  could not be evaluated.  2. Right ventricular systolic function is normal. The right ventricular size is normal. Mildly increased right ventricular wall thickness. There is moderately elevated pulmonary artery systolic pressure.  3. The mitral valve is degenerative. Mild to moderate mitral valve regurgitation. Moderate mitral  annular calcification.  4. Tricuspid valve regurgitation is moderate.  5. The aortic valve is tricuspid. There is mild calcification of the aortic valve. There is mild thickening of the aortic valve. Aortic valve regurgitation is not visualized. Aortic valve sclerosis/calcification is present, without any evidence of aortic stenosis.  6. Aortic dilatation noted. There is borderline dilatation of the aortic root, measuring 38 mm. There is mild dilatation of the ascending aorta, measuring 39 mm.  7. The inferior vena cava is normal in size with greater than 50% respiratory variability, suggesting right atrial pressure of 3 mmHg. FINDINGS  Left Ventricle: Left ventricular ejection fraction, by estimation, is 50 to 55%. The left ventricle has low normal function. The left ventricle has no regional wall motion abnormalities. The left ventricular internal cavity size was normal in size. There is mild left ventricular hypertrophy. Left ventricular diastolic function could not be evaluated due to atrial fibrillation. Left ventricular diastolic function could not be evaluated. Right Ventricle: The right ventricular size is normal. Mildly increased right ventricular wall thickness. Right ventricular systolic function is normal. There is moderately elevated  pulmonary artery systolic pressure. The tricuspid regurgitant velocity is 3.45 m/s, and with an assumed right atrial pressure of 3 mmHg, the estimated right ventricular systolic pressure is 50.6 mmHg. Left Atrium: Left atrial size was normal in size. Right Atrium: Right atrial size was normal in size. Pericardium: Trivial pericardial effusion is present. Mitral Valve: The mitral valve is degenerative in appearance. Moderate mitral annular calcification. Mild to moderate mitral valve regurgitation. Tricuspid Valve: The tricuspid valve is normal in structure. Tricuspid valve regurgitation is moderate. Aortic Valve: The aortic valve is tricuspid. There is mild calcification of the aortic valve. There is mild thickening of the aortic valve. Aortic valve regurgitation is not visualized. Aortic valve sclerosis/calcification is present, without any evidence of aortic stenosis. Aortic valve peak gradient measures 11.8 mmHg. Pulmonic Valve: The pulmonic valve was grossly normal. Pulmonic valve regurgitation is not visualized. No evidence of pulmonic stenosis. Aorta: Aortic dilatation noted. There is borderline dilatation of the aortic root, measuring 38 mm. There is mild dilatation of the ascending aorta, measuring 39 mm. Pulmonary Artery: The pulmonary artery is of normal size. Venous: The inferior vena cava is normal in size with greater than 50% respiratory variability, suggesting right atrial pressure of 3 mmHg. IAS/Shunts: The interatrial septum was not well visualized.  LEFT VENTRICLE PLAX 2D LVIDd:         4.90 cm     Diastology LVIDs:         3.60 cm     LV e' medial:    13.70 cm/s LV PW:         1.10 cm     LV E/e' medial:  11.0 LV IVS:        1.20 cm     LV e' lateral:   10.20 cm/s LVOT diam:     2.20 cm     LV E/e' lateral: 14.8 LV SV:         63 LV SV Index:   30 LVOT Area:     3.80 cm  LV Volumes (MOD) LV vol d, MOD A2C: 71.9 ml LV vol d, MOD A4C: 46.2 ml LV vol s, MOD A2C: 32.3 ml LV vol s, MOD A4C: 22.0 ml LV  SV MOD A2C:     39.6 ml LV SV MOD A4C:     46.2 ml LV SV MOD BP:      33.0 ml  RIGHT VENTRICLE RV Basal diam:  3.96 cm RV S prime:     17.80 cm/s TAPSE (M-mode): 1.8 cm LEFT ATRIUM             Index        RIGHT ATRIUM           Index LA diam:        4.00 cm 1.90 cm/m   RA Area:     12.50 cm LA Vol (A2C):   29.9 ml 14.19 ml/m  RA Volume:   28.80 ml  13.66 ml/m LA Vol (A4C):   36.1 ml 17.13 ml/m LA Biplane Vol: 33.1 ml 15.70 ml/m  AORTIC VALVE                 PULMONIC VALVE AV Area (Vmax): 2.13 cm     PV Vmax:        1.19 m/s AV Vmax:        172.00 cm/s  PV Peak grad:   5.7 mmHg AV Peak Grad:   11.8 mmHg    RVOT Peak grad: 3 mmHg LVOT Vmax:      96.50 cm/s LVOT Vmean:     61.000 cm/s LVOT VTI:       0.165 m  AORTA Ao Root diam: 3.80 cm Ao Asc diam:  3.90 cm MITRAL VALVE                TRICUSPID VALVE MV Area (PHT): 6.22 cm     TR Peak grad:   47.6 mmHg MV Decel Time: 122 msec     TR Vmax:        345.00 cm/s MV E velocity: 151.00 cm/s                             SHUNTS                             Systemic VTI:  0.16 m                             Systemic Diam: 2.20 cm Yvonne Kendall MD Electronically signed by Yvonne Kendall MD Signature Date/Time: 01/15/2024/12:52:02 PM    Final      Assessment and Plan  Billy Carr is a 78 y.o. male with medical history significant of paroxysmal atrial fibrillation s/p ablation (2015, 2016), SVT, prediabetes, CKD stage IIIa, who presents to the ED due to syncope x 2 On EMS arrival, patient was noted to be in SVTs with rates as high as 150-160. Patient received IV diltiazem times one and PO beta-blockers. His blood pressure went down in the 60s. Patient normally has low blood pressure at baseline. He was given a dose of midodrine and IV fluids.  Syncope Patient is presenting with 2 separate episodes of syncope with no prodrome concerning for cardiogenic etiology.  On EMS arrival, he was in SVT with rates as high as 160.  No once rates were slowed, demonstrated  underlying rhythm of atrial fibrillation with RVR.  After examination, rates were persistently ventricular bigeminy.  - [facility] consulted--EP to see on monday - Echocardiogram pending results - S/p diltiazem 10 mg once - Start metoprolol 12.5 mg twice daily for PVC suppression and rate control--now held due to soft BP - patient currently remains in sinus rhythm however heart rate goes up in  the 150s with ambulation in the ER. --3/31-- patient was evaluated by EP. Plans for TEE and DCCV tomorrow --4/1-- Awaiting cardiac procedure   Atrial fibrillation with RVR (HCC) --Previous history of atrial fibrillation s/p ablation in 2015 and 2016 with no reoccurrence.  After receiving diltiazem, EKG demonstrated atrial fibrillation with RVR.   -- CHA2DS2-VASc of 2  - Now on Eliquis 5 mg twice daily --Toprol XL 12.5 mg qd   Renal insufficiency Per chart review, patient has a history of renal insufficiency with creatinine ranging between 1 and 1.2.  Currently 1.3, not meeting criteria for AKI.  - Recieved  IV fluids --creat normalized   Procedures: Family communication : wife  Consults : [facility] cardiology CODE STATUS: full DVT Prophylaxis : eliquis Level of care: Telemetry Cardiac Status XB:[id number]-- TEE/DCCV today per EP recommendations    TOTAL TIME TAKING CARE OF THIS PATIENT: 35 minutes.  >50% time spent on counselling and coordination of care  Note: This dictation was prepared with Dragon dictation along with smaller phrase technology. Any transcriptional errors that result from this process are unintentional.  Enedina Finner M.D    [facility]   CC: Primary care physician; Marisue Ivan, MD

## 2024-01-16 NOTE — Procedures (Signed)
 Transesophageal Echocardiogram and DC cardioversion:  Indication: atrial fibrillation Requesting/ordering  physician:   Procedure Details:  Consent: Risks of procedure as well as the alternatives and risks of each were explained to the (patient/caregiver).  Consent for procedure obtained.  Time Out: Verified patient identification, verified procedure, site/side was marked, verified correct patient position, special equipment/implants available, medications/allergies/relevent history reviewed, required imaging and test results available.  Performed  Procedure: 10 ml of viscous lidocaine were given orally to provide local anesthesia to the oropharynx. The patient was positioned supine on the left side, bite block provided. The patient was sedated per anesthesia team. Using digital technique an omniplane probe was advanced into the esophagus without incident.   See report in EPIC  for complete details: In brief, imaging revealed normal LV function with no RWMAs and no mural apical thrombus.  .  Estimated ejection fraction was 50-55%.  Right sided cardiac chambers were normal with no evidence of pulmonary hypertension.  Imaging of the septum showed no ASD or VSD 2D and color flow confirmed no PFO  The LA was well visualized in orthogonal views.  There was no spontaneous contrast and no thrombus in the LA and LA appendage   Cardioversion procedure note For atrial fibrillation.  Patient placed on cardiac monitor, pulse oximetry, supplemental oxygen as necessary.   Sedation given: propofol IV per anesthesia team.  Pacer pads placed anterior and posterior chest.  Cardioverted 1 time(s).   Cardioverted at  200J. Synchronized biphasic Converted to NSR   Evaluation: Findings: Post procedure EKG shows: NSR Complications: None Patient did tolerate procedure well.  Time Spent Directly with the Patient:  45 minutes   Debbe Odea, M.D.    Arlys John Agbor-Etang 01/16/2024 1:30 PM

## 2024-01-17 ENCOUNTER — Encounter: Payer: Self-pay | Admitting: Cardiology

## 2024-01-17 ENCOUNTER — Ambulatory Visit: Attending: Cardiology

## 2024-01-17 ENCOUNTER — Telehealth: Payer: Self-pay | Admitting: Cardiovascular Disease

## 2024-01-17 DIAGNOSIS — R55 Syncope and collapse: Secondary | ICD-10-CM

## 2024-01-17 DIAGNOSIS — I4891 Unspecified atrial fibrillation: Secondary | ICD-10-CM | POA: Diagnosis not present

## 2024-01-17 MED ORDER — METOPROLOL SUCCINATE ER 25 MG PO TB24: 12.5000 mg | ORAL_TABLET | Freq: Every day | ORAL | 2 refills | Status: DC

## 2024-01-17 MED ORDER — APIXABAN 5 MG PO TABS: 5.0000 mg | ORAL_TABLET | Freq: Two times a day (BID) | ORAL | 2 refills | Status: DC

## 2024-01-17 NOTE — Telephone Encounter (Signed)
 Patient stated he was released from the hospital today and is following-up on getting a heart monitor.

## 2024-01-17 NOTE — Discharge Summary (Signed)
 Physician Discharge Summary   Patient: Billy Carr MRN: [id number] DOB: 11-Feb-1946  Admit date:     01/13/2024  Discharge date: 01/17/2024  Discharge Physician: Pennie Banter   PCP: Marisue Ivan, MD   Recommendations at discharge:  {Tip this will not be part of the note when signed- Example include specific recommendations for outpatient follow-up, pending tests to follow-up on. (Optional):26781}  ***  Discharge Diagnoses: Principal Problem:   Syncope Active Problems:   Atrial fibrillation with RVR (HCC)   Renal insufficiency   SVT (supraventricular tachycardia) (HCC)   Atypical atrial flutter (HCC)  Resolved Problems:   * No resolved hospital problems. [facility] Course: No notes on file  Assessment and Plan: * Syncope Patient is presenting with 2 separate episodes of syncope with no prodrome concerning for cardiogenic etiology.  On EMS arrival, he was in SVT with rates as high as 160.  No once rates were slowed, demonstrated underlying rhythm of atrial fibrillation with RVR.  After examination, rates were persistently ventricular bigeminy.  - Telemetry monitoring - Cardiology consulted; appreciate their recommendations - Echocardiogram - S/p diltiazem 10 mg once - Start metoprolol 12.5 mg twice daily for PVC suppression and rate control - IV fluids given large volume vomiting with syncope  Atrial fibrillation with RVR (HCC) Previous history of atrial fibrillation s/p ablation in 2015 and 2016 with no reoccurrence.  After receiving diltiazem, EKG demonstrated atrial fibrillation with RVR.  Previous CHA2DS2-VASc of 1, now 2 given age.  - Telemetry monitoring - Cardiology consulted; appreciate their recommendations - Start Eliquis 5 mg twice daily  Renal insufficiency Per chart review, patient has a history of renal insufficiency with creatinine ranging between 1 and 1.2.  Currently 1.3, not meeting criteria for AKI.  - IV fluids as ordered      {Tip  this will not be part of the note when signed Body mass index is 27.12 kg/m. , ,  (Optional):26781}  {(NOTE) Pain control PDMP Statment (Optional):26782} Consultants: *** Procedures performed: ***  Disposition: {Plan; Disposition:26390} Diet recommendation:  {Diet_Plan:26776} DISCHARGE MEDICATION: Allergies as of 01/17/2024   No Known Allergies      Medication List     TAKE these medications    apixaban 5 MG Tabs tablet Commonly known as: ELIQUIS Take 1 tablet (5 mg total) by mouth 2 (two) times daily.   ascorbic acid 500 MG tablet Commonly known as: VITAMIN C Take 500 mg by mouth daily.   metoprolol succinate 25 MG 24 hr tablet Commonly known as: TOPROL-XL Take 0.5 tablets (12.5 mg total) by mouth daily. Start taking on: January 18, 2024   multivitamin tablet Take 1 tablet by mouth daily.   VITAMIN D PO Take by mouth daily.   ZINC PO Take by mouth daily.        Discharge Exam: Filed Weights   01/13/24 1134 01/17/24 0500  Weight: 88.5 kg 90.7 kg   ***  Condition at discharge: {DC Condition:26389}  The results of significant diagnostics from this hospitalization (including imaging, microbiology, ancillary and laboratory) are listed below for reference.   Imaging Studies: ECHO TEE Result Date: 01/16/2024    TRANSESOPHOGEAL ECHO REPORT   Patient Name:   Billy Carr Date of Exam: 01/16/2024 Medical Rec #:  [id number]        Height:       72.0 in Accession #:    [id number]       Weight:       195.0 lb Date  of Birth:  23-Sep-1946         BSA:          2.108 m Patient Age:    77 years         BP:           115/77 mmHg Patient Gender: M                HR:           76 bpm. Exam Location:  [facility] Procedure: Transesophageal Echo, Cardiac Doppler and Color Doppler (Both            Spectral and Color Flow Doppler were utilized during procedure). Indications:     Atrial Flutter I48.92  History:         Patient has prior history of Echocardiogram examinations, most                   recent 01/15/2024. Arrythmias:Atrial Fibrillation and Atrial                  Flutter; Signs/Symptoms:Syncope.  Sonographer:     Cristela Blue Referring Phys:  [id number] AVWUJW RIDDLE Diagnosing Phys: Debbe Odea MD PROCEDURE: The transesophogeal probe was passed without difficulty through the esophogus of the patient. Sedation performed by different physician. The patient developed no complications during the procedure. A successful direct current cardioversion was  performed at 200 joules with 1 attempt.  IMPRESSIONS  1. Left ventricular ejection fraction, by estimation, is 50 to 55%. The left ventricle has low normal function.  2. Right ventricular systolic function is normal. The right ventricular size is normal.  3. No left atrial/left atrial appendage thrombus was detected.  4. The mitral valve is normal in structure. Mild to moderate mitral valve regurgitation.  5. Tricuspid valve regurgitation is mild to moderate.  6. The aortic valve is tricuspid. Aortic valve regurgitation is not visualized. Aortic valve sclerosis/calcification is present, without any evidence of aortic stenosis.  7. 3d not performed. FINDINGS  Left Ventricle: Left ventricular ejection fraction, by estimation, is 50 to 55%. The left ventricle has low normal function. The left ventricular internal cavity size was normal in size. Right Ventricle: The right ventricular size is normal. No increase in right ventricular wall thickness. Right ventricular systolic function is normal. Left Atrium: Left atrial size was normal in size. No left atrial/left atrial appendage thrombus was detected. Right Atrium: Right atrial size was normal in size. Pericardium: There is no evidence of pericardial effusion. Mitral Valve: The mitral valve is normal in structure. Mild to moderate mitral valve regurgitation. Tricuspid Valve: The tricuspid valve is normal in structure. Tricuspid valve regurgitation is mild to moderate. Aortic Valve: The aortic valve is  tricuspid. Aortic valve regurgitation is not visualized. Aortic valve sclerosis/calcification is present, without any evidence of aortic stenosis. Pulmonic Valve: The pulmonic valve was normal in structure. Pulmonic valve regurgitation is not visualized. Aorta: The aortic root is normal in size and structure. There is minimal (Grade I) plaque. IAS/Shunts: No atrial level shunt detected by color flow Doppler. Debbe Odea MD Electronically signed by Debbe Odea MD Signature Date/Time: 01/16/2024/2:27:14 PM    Final    ECHOCARDIOGRAM COMPLETE Result Date: 01/15/2024    ECHOCARDIOGRAM REPORT   Patient Name:   PARSA RICKETT Date of Exam: 01/14/2024 Medical Rec #:  [id number]        Height:       72.0 in Accession #:    [id number]  Weight:       195.0 lb Date of Birth:  22-Feb-1946         BSA:          2.108 m Patient Age:    77 years         BP:           94/70 mmHg Patient Gender: M                HR:           47 bpm. Exam Location:  [facility] Procedure: 2D Echo, Cardiac Doppler and Color Doppler (Both Spectral and Color            Flow Doppler were utilized during procedure). Indications:     Syncope R55                  Atrial Fibrillation I48.91  History:         Patient has prior history of Echocardiogram examinations, most                  recent 12/29/2020.  Sonographer:     Overton Mam RDCS, FASE Referring Phys:  [id number] Verdene Lennert Diagnosing Phys: Yvonne Kendall MD IMPRESSIONS  1. Left ventricular ejection fraction, by estimation, is 50 to 55%. The left ventricle has low normal function. The left ventricle has no regional wall motion abnormalities. There is mild left ventricular hypertrophy. Left ventricular diastolic function  could not be evaluated.  2. Right ventricular systolic function is normal. The right ventricular size is normal. Mildly increased right ventricular wall thickness. There is moderately elevated pulmonary artery systolic pressure.  3. The mitral valve is degenerative.  Mild to moderate mitral valve regurgitation. Moderate mitral annular calcification.  4. Tricuspid valve regurgitation is moderate.  5. The aortic valve is tricuspid. There is mild calcification of the aortic valve. There is mild thickening of the aortic valve. Aortic valve regurgitation is not visualized. Aortic valve sclerosis/calcification is present, without any evidence of aortic stenosis.  6. Aortic dilatation noted. There is borderline dilatation of the aortic root, measuring 38 mm. There is mild dilatation of the ascending aorta, measuring 39 mm.  7. The inferior vena cava is normal in size with greater than 50% respiratory variability, suggesting right atrial pressure of 3 mmHg. FINDINGS  Left Ventricle: Left ventricular ejection fraction, by estimation, is 50 to 55%. The left ventricle has low normal function. The left ventricle has no regional wall motion abnormalities. The left ventricular internal cavity size was normal in size. There is mild left ventricular hypertrophy. Left ventricular diastolic function could not be evaluated due to atrial fibrillation. Left ventricular diastolic function could not be evaluated. Right Ventricle: The right ventricular size is normal. Mildly increased right ventricular wall thickness. Right ventricular systolic function is normal. There is moderately elevated pulmonary artery systolic pressure. The tricuspid regurgitant velocity is 3.45 m/s, and with an assumed right atrial pressure of 3 mmHg, the estimated right ventricular systolic pressure is 50.6 mmHg. Left Atrium: Left atrial size was normal in size. Right Atrium: Right atrial size was normal in size. Pericardium: Trivial pericardial effusion is present. Mitral Valve: The mitral valve is degenerative in appearance. Moderate mitral annular calcification. Mild to moderate mitral valve regurgitation. Tricuspid Valve: The tricuspid valve is normal in structure. Tricuspid valve regurgitation is moderate. Aortic Valve:  The aortic valve is tricuspid. There is mild calcification of the aortic valve. There is mild thickening of the aortic valve. Aortic  valve regurgitation is not visualized. Aortic valve sclerosis/calcification is present, without any evidence of aortic stenosis. Aortic valve peak gradient measures 11.8 mmHg. Pulmonic Valve: The pulmonic valve was grossly normal. Pulmonic valve regurgitation is not visualized. No evidence of pulmonic stenosis. Aorta: Aortic dilatation noted. There is borderline dilatation of the aortic root, measuring 38 mm. There is mild dilatation of the ascending aorta, measuring 39 mm. Pulmonary Artery: The pulmonary artery is of normal size. Venous: The inferior vena cava is normal in size with greater than 50% respiratory variability, suggesting right atrial pressure of 3 mmHg. IAS/Shunts: The interatrial septum was not well visualized.  LEFT VENTRICLE PLAX 2D LVIDd:         4.90 cm     Diastology LVIDs:         3.60 cm     LV e' medial:    13.70 cm/s LV PW:         1.10 cm     LV E/e' medial:  11.0 LV IVS:        1.20 cm     LV e' lateral:   10.20 cm/s LVOT diam:     2.20 cm     LV E/e' lateral: 14.8 LV SV:         63 LV SV Index:   30 LVOT Area:     3.80 cm  LV Volumes (MOD) LV vol d, MOD A2C: 71.9 ml LV vol d, MOD A4C: 46.2 ml LV vol s, MOD A2C: 32.3 ml LV vol s, MOD A4C: 22.0 ml LV SV MOD A2C:     39.6 ml LV SV MOD A4C:     46.2 ml LV SV MOD BP:      33.0 ml RIGHT VENTRICLE RV Basal diam:  3.96 cm RV S prime:     17.80 cm/s TAPSE (M-mode): 1.8 cm LEFT ATRIUM             Index        RIGHT ATRIUM           Index LA diam:        4.00 cm 1.90 cm/m   RA Area:     12.50 cm LA Vol (A2C):   29.9 ml 14.19 ml/m  RA Volume:   28.80 ml  13.66 ml/m LA Vol (A4C):   36.1 ml 17.13 ml/m LA Biplane Vol: 33.1 ml 15.70 ml/m  AORTIC VALVE                 PULMONIC VALVE AV Area (Vmax): 2.13 cm     PV Vmax:        1.19 m/s AV Vmax:        172.00 cm/s  PV Peak grad:   5.7 mmHg AV Peak Grad:   11.8 mmHg     RVOT Peak grad: 3 mmHg LVOT Vmax:      96.50 cm/s LVOT Vmean:     61.000 cm/s LVOT VTI:       0.165 m  AORTA Ao Root diam: 3.80 cm Ao Asc diam:  3.90 cm MITRAL VALVE                TRICUSPID VALVE MV Area (PHT): 6.22 cm     TR Peak grad:   47.6 mmHg MV Decel Time: 122 msec     TR Vmax:        345.00 cm/s MV E velocity: 151.00 cm/s  SHUNTS                             Systemic VTI:  0.16 m                             Systemic Diam: 2.20 cm Yvonne Kendall MD Electronically signed by Yvonne Kendall MD Signature Date/Time: 01/15/2024/12:52:02 PM    Final    CT Head Wo Contrast Result Date: 01/13/2024 CLINICAL DATA:  Head trauma, minor (Age >= 65y); Neck trauma (Age >= 65y). Two syncopal episodes. Head strike. Nausea and vomiting. EXAM: CT HEAD WITHOUT CONTRAST CT CERVICAL SPINE WITHOUT CONTRAST TECHNIQUE: Multidetector CT imaging of the head and cervical spine was performed following the standard protocol without intravenous contrast. Multiplanar CT image reconstructions of the cervical spine were also generated. RADIATION DOSE REDUCTION: This exam was performed according to the departmental dose-optimization program which includes automated exposure control, adjustment of the mA and/or kV according to patient size and/or use of iterative reconstruction technique. COMPARISON:  CT head and cervical spine 07/31/2012 FINDINGS: CT HEAD FINDINGS Brain: There is no evidence of an acute infarct, intracranial hemorrhage, mass, midline shift, or extra-axial fluid collection. Cerebral volume is within normal limits for age. The ventricles are normal in size. Cerebral white matter hypodensities are nonspecific but compatible with mild chronic small vessel ischemic disease. Vascular: Calcified atherosclerosis at the skull base. No hyperdense vessel. Skull: No acute fracture or suspicious lesion. Sinuses/Orbits: Visualized paranasal sinuses and mastoid air cells are clear. Bilateral cataract  extraction. Other: None. CT CERVICAL SPINE FINDINGS Alignment: Straightening of the normal cervical lordosis. Chronic trace anterolisthesis of C7 on T1. Skull base and vertebrae: No acute fracture or suspicious lesion. Soft tissues and spinal canal: No prevertebral fluid or swelling. No visible canal hematoma. Disc levels: Diffuse cervical disc degeneration, greatest at C5-6 and C6-7 with at least mild spinal stenosis and moderate neural foraminal stenosis at both levels. Mild-to-moderate multilevel facet arthrosis. Upper chest: The included lung apices are clear. Other: Mild atherosclerotic calcification at the carotid bifurcations. IMPRESSION: 1. No evidence of acute intracranial abnormality or cervical spine fracture. 2. Mild chronic small vessel ischemic disease. Electronically Signed   By: Sebastian Ache M.D.   On: 01/13/2024 12:45   CT Cervical Spine Wo Contrast Result Date: 01/13/2024 CLINICAL DATA:  Head trauma, minor (Age >= 65y); Neck trauma (Age >= 65y). Two syncopal episodes. Head strike. Nausea and vomiting. EXAM: CT HEAD WITHOUT CONTRAST CT CERVICAL SPINE WITHOUT CONTRAST TECHNIQUE: Multidetector CT imaging of the head and cervical spine was performed following the standard protocol without intravenous contrast. Multiplanar CT image reconstructions of the cervical spine were also generated. RADIATION DOSE REDUCTION: This exam was performed according to the departmental dose-optimization program which includes automated exposure control, adjustment of the mA and/or kV according to patient size and/or use of iterative reconstruction technique. COMPARISON:  CT head and cervical spine 07/31/2012 FINDINGS: CT HEAD FINDINGS Brain: There is no evidence of an acute infarct, intracranial hemorrhage, mass, midline shift, or extra-axial fluid collection. Cerebral volume is within normal limits for age. The ventricles are normal in size. Cerebral white matter hypodensities are nonspecific but compatible with mild  chronic small vessel ischemic disease. Vascular: Calcified atherosclerosis at the skull base. No hyperdense vessel. Skull: No acute fracture or suspicious lesion. Sinuses/Orbits: Visualized paranasal sinuses and mastoid air cells are clear. Bilateral cataract extraction. Other: None. CT  CERVICAL SPINE FINDINGS Alignment: Straightening of the normal cervical lordosis. Chronic trace anterolisthesis of C7 on T1. Skull base and vertebrae: No acute fracture or suspicious lesion. Soft tissues and spinal canal: No prevertebral fluid or swelling. No visible canal hematoma. Disc levels: Diffuse cervical disc degeneration, greatest at C5-6 and C6-7 with at least mild spinal stenosis and moderate neural foraminal stenosis at both levels. Mild-to-moderate multilevel facet arthrosis. Upper chest: The included lung apices are clear. Other: Mild atherosclerotic calcification at the carotid bifurcations. IMPRESSION: 1. No evidence of acute intracranial abnormality or cervical spine fracture. 2. Mild chronic small vessel ischemic disease. Electronically Signed   By: Sebastian Ache M.D.   On: 01/13/2024 12:45   DG Chest Port 1 View Result Date: 01/13/2024 CLINICAL DATA:  Chest pain EXAM: PORTABLE CHEST 1 VIEW COMPARISON:  07/31/2012 FINDINGS: The heart size and mediastinal contours are within normal limits. Aortic atherosclerosis. No focal airspace consolidation, pleural effusion, or pneumothorax. The visualized skeletal structures are unremarkable. IMPRESSION: No active disease. Electronically Signed   By: Duanne Guess D.O.   On: 01/13/2024 12:24    Microbiology: Results for orders placed or performed during the hospital encounter of 01/13/24  Resp panel by RT-PCR (RSV, Flu A&B, Covid) Anterior Nasal Swab     Status: None   Collection Time: 01/13/24 12:17 PM   Specimen: Anterior Nasal Swab  Result Value Ref Range Status   SARS Coronavirus 2 by RT PCR NEGATIVE NEGATIVE Final    Comment: (NOTE) SARS-CoV-2 target  nucleic acids are NOT DETECTED.  The SARS-CoV-2 RNA is generally detectable in upper respiratory specimens during the acute phase of infection. The lowest concentration of SARS-CoV-2 viral copies this assay can detect is 138 copies/mL. A negative result does not preclude SARS-Cov-2 infection and should not be used as the sole basis for treatment or other patient management decisions. A negative result may occur with  improper specimen collection/handling, submission of specimen other than nasopharyngeal swab, presence of viral mutation(s) within the areas targeted by this assay, and inadequate number of viral copies(<138 copies/mL). A negative result must be combined with clinical observations, patient history, and epidemiological information. The expected result is Negative.  Fact Sheet for Patients:  BloggerCourse.com  Fact Sheet for Healthcare Providers:  SeriousBroker.it  This test is no t yet approved or cleared by the [place] FDA and  has been authorized for detection and/or diagnosis of SARS-CoV-2 by FDA under an Emergency Use Authorization (EUA). This EUA will remain  in effect (meaning this test can be used) for the duration of the COVID-19 declaration under Section 564(b)(1) of the Act, 21 U.S.C.section 360bbb-3(b)(1), unless the authorization is terminated  or revoked sooner.       Influenza A by PCR NEGATIVE NEGATIVE Final   Influenza B by PCR NEGATIVE NEGATIVE Final    Comment: (NOTE) The Xpert Xpress SARS-CoV-2/FLU/RSV plus assay is intended as an aid in the diagnosis of influenza from Nasopharyngeal swab specimens and should not be used as a sole basis for treatment. Nasal washings and aspirates are unacceptable for Xpert Xpress SARS-CoV-2/FLU/RSV testing.  Fact Sheet for Patients: BloggerCourse.com  Fact Sheet for Healthcare  Providers: SeriousBroker.it  This test is not yet approved or cleared by the [place] FDA and has been authorized for detection and/or diagnosis of SARS-CoV-2 by FDA under an Emergency Use Authorization (EUA). This EUA will remain in effect (meaning this test can be used) for the duration of the COVID-19 declaration under Section 564(b)(1) of the Act,  21 U.S.C. section 360bbb-3(b)(1), unless the authorization is terminated or revoked.     Resp Syncytial Virus by PCR NEGATIVE NEGATIVE Final    Comment: (NOTE) Fact Sheet for Patients: BloggerCourse.com  Fact Sheet for Healthcare Providers: SeriousBroker.it  This test is not yet approved or cleared by the [place] FDA and has been authorized for detection and/or diagnosis of SARS-CoV-2 by FDA under an Emergency Use Authorization (EUA). This EUA will remain in effect (meaning this test can be used) for the duration of the COVID-19 declaration under Section 564(b)(1) of the Act, 21 U.S.C. section 360bbb-3(b)(1), unless the authorization is terminated or revoked.  Performed at [facility], [address] [place], [place] [address]     Labs: CBC: Recent Labs  Lab 01/13/24 1129  WBC 12.7*  HGB 12.9*  HCT 40.2  MCV 92.4  PLT 203   Basic Metabolic Panel: Recent Labs  Lab 01/13/24 1129 01/13/24 1133 01/15/24 0507  NA 139  --  139  K 4.1  --  3.9  CL 108  --  107  CO2 22  --  22  GLUCOSE 150*  --  97  BUN 37*  --  24*  CREATININE 1.31*  --  0.90  CALCIUM 8.3*  --  7.7*  MG  --  1.8  --   PHOS  --  2.7  --    Liver Function Tests: Recent Labs  Lab 01/13/24 1133  AST 26  ALT 15  ALKPHOS 68  BILITOT 1.2  PROT 7.1  ALBUMIN 3.7   CBG: Recent Labs  Lab 01/14/24 0038  GLUCAP 114*    Discharge time spent: {LESS THAN/GREATER THAN:26388} 30 minutes.  Signed: Pennie Banter, DO [facility] 01/17/2024

## 2024-01-17 NOTE — Telephone Encounter (Signed)
 ED notes reviewed by Charlsie Quest, NP who recommended ordering 14 day Zio AT. Pt made aware and order placed.

## 2024-01-22 DIAGNOSIS — R55 Syncope and collapse: Secondary | ICD-10-CM | POA: Diagnosis not present

## 2024-01-30 ENCOUNTER — Ambulatory Visit: Attending: Cardiology | Admitting: Cardiology

## 2024-01-30 ENCOUNTER — Encounter: Payer: Self-pay | Admitting: Cardiology

## 2024-01-30 VITALS — BP 118/69 | HR 66 | Resp 16 | Ht 72.0 in | Wt 192.0 lb

## 2024-01-30 DIAGNOSIS — I4891 Unspecified atrial fibrillation: Secondary | ICD-10-CM | POA: Diagnosis not present

## 2024-01-30 DIAGNOSIS — R55 Syncope and collapse: Secondary | ICD-10-CM

## 2024-01-30 DIAGNOSIS — I4719 Other supraventricular tachycardia: Secondary | ICD-10-CM | POA: Diagnosis not present

## 2024-01-30 DIAGNOSIS — I484 Atypical atrial flutter: Secondary | ICD-10-CM

## 2024-01-30 DIAGNOSIS — D6869 Other thrombophilia: Secondary | ICD-10-CM | POA: Diagnosis not present

## 2024-01-30 NOTE — Progress Notes (Signed)
 Electrophysiology Clinic Note    Date:  01/30/2024  Patient ID:  Billy Carr, Billy Carr 1946/07/19, MRN [id number] PCP:  Marisue Ivan, MD  Cardiologist:  Lorine Bears, MD Electrophysiologist: Lanier Prude, MD   Discussed the use of AI scribe software for clinical note transcription with the patient, who gave verbal consent to proceed.   Patient Profile    Chief Complaint: hospital follow-up  History of Present Illness: Billy Carr is a 78 y.o. male with PMH notable for parox AFib, atypical atrial flutter, Atach, orthostatic intolerance, syncope; seen today for Lanier Prude, MD for post hospital follow up.    He is s/p AF ablation 10/2013 and 05/2014 at [facility]. He was loaded on tikosyn in 10/2013, and stopped 12/2014 d/t no AFib.   He was remotely referred to [facility] EP in 2022 for syncope evaluation with history of NSVT on monitor, cMRI at that time (2022) showed mild RV enlargement, no significant RV or LV dysfunction, no concern for HCM.   He presented to [facility] 3/29 after 2 episodes of syncope.  The first episode happened the evening prior after eating dinner, had nausea with vomiting and diarrhea. He thought the syncope was related to his N/V/D and so went to bed. He woke the morning of 3/29, took a shower, and had second episode of sycnope while walking to closet. EMS was called at that time, was found to be in SVT unresponsive to 6mg , and then 12mg  adenosine. He was off eliquis and ASA.   He is s/p TEE/DCCV 4/1 with return to sinus rhythm. Discharged with eliquis, low dose toprol, and with an ambulatory monitor   On follow-up today, he is doing very well. He is tolerating eliquis well, no missed doses. He does have significant bruising to hands/forearms.  He continues to wear apple watch to monitor HR, no alerts for AFib He has returned to working out at the gym, but not quite back to 100%. A friend recently had a Watchman placed and he questions whether  this is an option for him as he does not want to be on eliquis if possible.    he denies chest pain, palpitations, dyspnea, PND, orthopnea, nausea, vomiting, dizziness, syncope, edema, weight gain, or early satiety.      Arrhythmia/Device History Tikosyn - stopped d/t no AF      ROS:  Please see the history of present illness. All other systems are reviewed and otherwise negative.    Physical Exam    VS:  BP 118/69 (BP Location: Left Arm, Patient Position: Sitting, Cuff Size: Normal)   Pulse 66   Resp 16   Ht 6' (1.829 m)   Wt 192 lb (87.1 kg)   SpO2 98%   BMI 26.04 kg/m  BMI: Body mass index is 26.04 kg/m.  Wt Readings from Last 3 Encounters:  01/30/24 192 lb (87.1 kg)  01/17/24 199 lb 15.3 oz (90.7 kg)  10/31/23 201 lb 8 oz (91.4 kg)     GEN- The patient is well appearing, alert and oriented x 3 today.   Lungs- Clear to ausculation bilaterally, normal work of breathing.  Heart- Regular rate and rhythm, no murmurs, rubs or gallops Extremities- No peripheral edema, warm, dry    Studies Reviewed   Previous EP, cardiology notes.    EKG is ordered. Personal review of EKG from today shows:    EKG Interpretation Date/Time:  Tuesday January 30 2024 13:32:02 EDT Ventricular Rate:  62 PR  Interval:  162 QRS Duration:  108 QT Interval:  416 QTC Calculation: 422 R Axis:   -70  Text Interpretation: Normal sinus rhythm with sinus arrhythmia Left anterior fascicular block Confirmed by Sherie Don [phone] on 01/30/2024 1:36:44 PM    TEE, 01/16/2024  1. Left ventricular ejection fraction, by estimation, is 50 to 55%. The left ventricle has low normal function.   2. Right ventricular systolic function is normal. The right ventricular size is normal.   3. No left atrial/left atrial appendage thrombus was detected.   4. The mitral valve is normal in structure. Mild to moderate mitral valve regurgitation.   5. Tricuspid valve regurgitation is mild to moderate.   6. The aortic  valve is tricuspid. Aortic valve regurgitation is not visualized. Aortic valve sclerosis/calcification is present, without any evidence of aortic stenosis.   7. 3d not performed.   TTE, 01/14/2024  1. Left ventricular ejection fraction, by estimation, is 50 to 55%. The left ventricle has low normal function. The left ventricle has no regional wall motion abnormalities. There is mild left ventricular hypertrophy. Left ventricular diastolic function  could not be evaluated.   2. Right ventricular systolic function is normal. The right ventricular size is normal. Mildly increased right ventricular wall thickness. There is moderately elevated pulmonary artery systolic pressure.   3. The mitral valve is degenerative. Mild to moderate mitral valve regurgitation. Moderate mitral annular calcification.   4. Tricuspid valve regurgitation is moderate.   5. The aortic valve is tricuspid. There is mild calcification of the aortic valve. There is mild thickening of the aortic valve. Aortic valve regurgitation is not visualized. Aortic valve sclerosis/calcification is present, without any evidence of  aortic stenosis.   6. Aortic dilatation noted. There is borderline dilatation of the aortic root, measuring 38 mm. There is mild dilatation of the ascending aorta, measuring 39 mm.   7. The inferior vena cava is normal in size with greater than 50% respiratory variability, suggesting right atrial pressure of 3 mmHg.    Assessment and Plan     #) parox AFib #) atypical flutter #) syncope S/p ablation x 2 at [facility] (2015) Recently presented to [facility] ER with syncope x 2, found to be in atrial flutter S/p DCCV and maintaining sinus rhythm since No further AF by apple watch or zio live AT, will continue to monitor Continue 12.5mg  toprol daily If syncope recurs, consider ILR implant  #) Hypercoag d/t parox afib CHA2DS2-VASc Score = at least 2 [CHF History: 0, HTN History: 0, Diabetes History: 0, Stroke History: 0,  Vascular Disease History: 0, Age Score: 2, Gender Score: 0].  Therefore, the patient's annual risk of stroke is 2.2 %.    Stroke ppx - 5mg  eliquis BID, appropriately dosed No significant bleeding, though patient does have bruising to bilateral hands/forearms He requests to begin workup for Watchman procedure, which I think is reasonable.         Current medicines are reviewed at length with the patient today.   The patient has concerns regarding his medicines.  The following changes were made today:  none  Labs/ tests ordered today include:  Orders Placed This Encounter  Procedures   EKG 12-Lead     Disposition: Follow up with Dr. Lalla Brothers or EP APP in 3 months, sooner for watchman eval as appropriate   Signed, Sherie Don, NP  01/30/24  3:19 PM  Electrophysiology [facility]

## 2024-01-30 NOTE — Patient Instructions (Signed)
 Medication Instructions:  The current medical regimen is effective;  continue present plan and medications as directed. Please refer to the Current Medication list given to you today.   *If you need a refill on your cardiac medications before your next appointment, please call your pharmacy*   Follow-Up: At [facility], you and your health needs are our priority.  As part of our continuing mission to provide you with exceptional heart care, our providers are all part of one team.  This team includes your primary Cardiologist (physician) and Advanced Practice Providers or APPs (Physician Assistants and Nurse Practitioners) who all work together to provide you with the care you need, when you need it.  Your next appointment:   3 month(s)  Provider:   Suzann Riddle, NP    We recommend signing up for the patient portal called "MyChart".  Sign up information is provided on this After Visit Summary.  MyChart is used to connect with patients for Virtual Visits (Telemedicine).  Patients are able to view lab/test results, encounter notes, upcoming appointments, etc.  Non-urgent messages can be sent to your provider as well.   To learn more about what you can do with MyChart, go to [email or web address].   Other Instructions Please notify us  if you have more AFIB episodes or have any syncope episodes.

## 2024-02-06 ENCOUNTER — Ambulatory Visit: Attending: Cardiology | Admitting: Cardiology

## 2024-02-06 ENCOUNTER — Encounter: Payer: Self-pay | Admitting: Cardiology

## 2024-02-06 VITALS — BP 110/64 | HR 55 | Ht 72.0 in | Wt 194.2 lb

## 2024-02-06 DIAGNOSIS — I480 Paroxysmal atrial fibrillation: Secondary | ICD-10-CM | POA: Diagnosis not present

## 2024-02-06 DIAGNOSIS — I484 Atypical atrial flutter: Secondary | ICD-10-CM

## 2024-02-06 DIAGNOSIS — R55 Syncope and collapse: Secondary | ICD-10-CM

## 2024-02-06 DIAGNOSIS — D6869 Other thrombophilia: Secondary | ICD-10-CM | POA: Diagnosis not present

## 2024-02-06 NOTE — Progress Notes (Signed)
 Electrophysiology Office Note:   Date:  02/06/2024  ID:  EIN RIJO, DOB [birth year], MRN [id number]  Primary Cardiologist: Antionette Kirks, MD Electrophysiologist: Ardeen Kohler, MD      History of Present Illness:   Billy Carr is a 78 y.o. male with h/o parox AFib, atypical atrial flutter, atrial tachycardia, orthostatic intolerance, syncope who is being seen today for dilation for Watchman device implant.  Discussed the use of AI scribe software for clinical note transcription with the patient, who gave verbal consent to proceed.  History of Present Illness He has a long-standing history of atrial fibrillation, initially diagnosed in 1967 following a syncope episode. He is s/p AF ablation 10/2013 and 05/2014 at [facility]. He was loaded on tikosyn in 10/2013, and stopped 12/2014 d/t no AFib. His treatment regimen has been adjusted multiple times, including periods off metoprolol  and Eliquis , and a trial of baby aspirin , which was later discontinued due to concerns about long-term use. He has also experienced recurrent episodes of syncope since 1967, which have been a significant concern, particularly in the context of anticoagulation therapy due to the risk of bleeding. He has baseline balance issues and orthostatic intolerance. Otherwise doing well. Tries to be very active. No new or acute complaints.   Review of systems complete and found to be negative unless listed in HPI.   EP Information / Studies Reviewed:    EKG is not ordered today. EKG from 01/30/24 reviewed which showed sinus rhythm with PR and QRS .     EKG 01/13/24: AF   TEE 01/16/24:   1. Left ventricular ejection fraction, by estimation, is 50 to 55%. The  left ventricle has low normal function.   2. Right ventricular systolic function is normal. The right ventricular  size is normal.   3. No left atrial/left atrial appendage thrombus was detected.   4. The mitral valve is normal in structure. Mild to moderate  mitral valve  regurgitation.   5. Tricuspid valve regurgitation is mild to moderate.   6. The aortic valve is tricuspid. Aortic valve regurgitation is not  visualized. Aortic valve sclerosis/calcification is present, without any  evidence of aortic stenosis.   7. 3d not performed.   Echo 01/14/24:   1. Left ventricular ejection fraction, by estimation, is 50 to 55%. The  left ventricle has low normal function. The left ventricle has no regional  wall motion abnormalities. There is mild left ventricular hypertrophy.  Left ventricular diastolic function   could not be evaluated.   2. Right ventricular systolic function is normal. The right ventricular  size is normal. Mildly increased right ventricular wall thickness. There  is moderately elevated pulmonary artery systolic pressure.   3. The mitral valve is degenerative. Mild to moderate mitral valve  regurgitation. Moderate mitral annular calcification.   4. Tricuspid valve regurgitation is moderate.   5. The aortic valve is tricuspid. There is mild calcification of the  aortic valve. There is mild thickening of the aortic valve. Aortic valve  regurgitation is not visualized. Aortic valve sclerosis/calcification is  present, without any evidence of  aortic stenosis.   6. Aortic dilatation noted. There is borderline dilatation of the aortic  root, measuring 38 mm. There is mild dilatation of the ascending aorta,  measuring 39 mm.   7. The inferior vena cava is normal in size with greater than 50%  respiratory variability, suggesting right atrial pressure of 3 mmHg.   03/05/21 Cardiac MRI:  IMPRESSION: Normal LV and RV  function without significant late gadolinium enhancement.   Study not suggestive of hypertrophic cardiomyopathy  Risk Assessment/Calculations:    CHA2DS2-VASc Score = 2   This indicates a 2.2% annual risk of stroke. The patient's score is based upon: CHF History: 0 HTN History: 0 Diabetes History: 0 Stroke  History: 0 Vascular Disease History: 0 Age Score: 2 Gender Score: 0          Physical Exam:   VS:  BP 110/64   Pulse (!) 55   Ht 6' (1.829 m)   Wt 194 lb 3.2 oz (88.1 kg)   SpO2 97%   BMI 26.34 kg/m    Wt Readings from Last 3 Encounters:  02/06/24 194 lb 3.2 oz (88.1 kg)  01/30/24 192 lb (87.1 kg)  01/17/24 199 lb 15.3 oz (90.7 kg)     GEN: Well nourished, well developed in no acute distress NECK: No JVD CARDIAC: Bradycardic, regular  RESPIRATORY:  Clear to auscultation without rales, wheezing or rhonchi  ABDOMEN: Soft, non-distended EXTREMITIES:  No edema; No deformity   ASSESSMENT AND PLAN:   I have seen Billy Carr in the office today who is being considered for a Watchman left atrial appendage closure device. I believe they will benefit from this procedure given their history of atrial fibrillation, CHA2DS2-VASc score of 2 and unadjusted ischemic stroke rate of 2.2% per year. Unfortunately, the patient is not felt to be a long term anticoagulation candidate secondary to recurrent syncope and collapse, balance issues, fall risk. The patient's chart has been reviewed and I feel that they would be a candidate for short term oral anticoagulation after Watchman implant.   It is my belief that after undergoing a LAA closure procedure, Billy Carr will not need long term anticoagulation which eliminates anticoagulation side effects and major bleeding risk.   Procedural risks for the Watchman implant have been reviewed with the patient including a 0.5% risk of stroke, <1% risk of perforation and <1% risk of device embolization. Other risks include bleeding, vascular damage, tamponade, worsening renal function, and death. The patient understands these risk and wishes to proceed.     The published clinical data on the safety and effectiveness of WATCHMAN include but are not limited to the following: - Holmes DR, Evalene Hilda, Sick P et al. for the PROTECT AF Investigators.  Percutaneous closure of the left atrial appendage versus warfarin therapy for prevention of stroke in patients with atrial fibrillation: a randomised non-inferiority trial. Lancet 2009; 374: 534-42. Evalene Hilda, Doshi SK, Deloria Fetch D et al. on behalf of the PROTECT AF Investigators. Percutaneous Left Atrial Appendage Closure for Stroke Prophylaxis in Patients With Atrial Fibrillation 2.3-Year Follow-up of the PROTECT AF (Watchman Left Atrial Appendage System for Embolic Protection in Patients With Atrial Fibrillation) Trial. Circulation 2013; 127:720-729. - Alli O, Doshi S,  Kar S, Reddy VY, Sievert H et al. Quality of Life Assessment in the Randomized PROTECT AF (Percutaneous Closure of the Left Atrial Appendage Versus Warfarin Therapy for Prevention of Stroke in Patients With Atrial Fibrillation) Trial of Patients at Risk for Stroke With Nonvalvular Atrial Fibrillation. J Am Coll Cardiol 2013; 61:1790-8. Bartholome Ligas DR, Mario Sicard, Price M, Whisenant B, Sievert H, Doshi S, Huber K, Reddy V. Prospective randomized evaluation of the Watchman left atrial appendage Device in patients with atrial fibrillation versus long-term warfarin therapy; the PREVAIL trial. Journal of the [employer] of Cardiology, Vol. 4, No. 1, 2014, 1-11. - Kar S, Doshi SK, Sadhu  A, Horton R, Osorio J et al. Primary outcome evaluation of a next-generation left atrial appendage closure device: results from the PINNACLE FLX trial. Circulation 2021;143(18)1754-1762.    After today's visit with the patient which was dedicated solely for shared decision making visit regarding LAA closure device, the patient would like to think about his options. If he decides to pursue Watchman implant, then I would like to obtain a gated CT scan of the chest with contrast timed for PV/LA visualization.    HAS-BLED score 1 Hypertension No  Abnormal renal and liver function (Dialysis, transplant, Cr >2.26 mg/dL /Cirrhosis or Bilirubin >2x Normal or  AST/ALT/AP >3x Normal) No  Stroke No  Bleeding No  Labile INR (Unstable/high INR) No  Elderly (>65) Yes  Drugs or alcohol (>= 8 drinks/week, anti-plt or NSAID) No   CHA2DS2-VASc Score = 2  The patient's score is based upon: CHF History: 0 HTN History: 0 Diabetes History: 0 Stroke History: 0 Vascular Disease History: 0 Age Score: 2 Gender Score: 0       ASSESSMENT AND PLAN: Paroxysmal Atrial Fibrillation (ICD10:  I48.0) Atypical Atrial Flutter The patient's CHA2DS2-VASc score is 2, indicating a 2.2% annual risk of stroke.  He has maintained sinus since cardioversion.   Secondary Hypercoagulable State (ICD10:  D68.69){Click to add to Prob List or Visit Dx  : The patient is at significant risk for stroke/thromboembolism based upon his CHA2DS2-VASc Score of 2.  Continue Apixaban  (Eliquis ).    Signed, Ardeen Kohler, MD

## 2024-02-06 NOTE — Patient Instructions (Signed)
 Medication Instructions:  Your physician recommends that you continue on your current medications as directed. Please refer to the Current Medication list given to you today.  *If you need a refill on your cardiac medications before your next appointment, please call your pharmacy*  Testing/Procedures: Watchman  Your physician has requested that you have Left atrial appendage (LAA) closure device implantation is a procedure to put a small device in the LAA of the heart. The LAA is a small sac in the wall of the heart's left upper chamber. Blood clots can form in this area. The device, Watchman closes the LAA to help prevent a blood clot and stroke.   Follow-Up: At [facility], you and your health needs are our priority.  As part of our continuing mission to provide you with exceptional heart care, our providers are all part of one team.  This team includes your primary Cardiologist (physician) and Advanced Practice Providers or APPs (Physician Assistants and Nurse Practitioners) who all work together to provide you with the care you need, when you need it.  Please contact Nurse Navigator, Larkin Plumb at [phone] if you decided to proceed with scheduling the Watchman procedure

## 2024-02-14 DIAGNOSIS — R55 Syncope and collapse: Secondary | ICD-10-CM

## 2024-02-15 DIAGNOSIS — L570 Actinic keratosis: Secondary | ICD-10-CM | POA: Diagnosis not present

## 2024-02-15 DIAGNOSIS — D0461 Carcinoma in situ of skin of right upper limb, including shoulder: Secondary | ICD-10-CM | POA: Diagnosis not present

## 2024-02-15 DIAGNOSIS — D045 Carcinoma in situ of skin of trunk: Secondary | ICD-10-CM | POA: Diagnosis not present

## 2024-02-15 NOTE — Progress Notes (Signed)
 Average heart rate of 66 bpm.  Several elevated heart rates noted.  No atrial fibrillation, no arrhythmias.  Continue to keep appointment with Lyle San, NP on 05/06/2024.

## 2024-05-05 NOTE — Progress Notes (Unsigned)
 Electrophysiology Clinic Note    Date:  05/06/2024  Patient ID:  Billy Carr, Billy Carr Mar 03, 1946, MRN [id number] PCP:  Alla Amis, MD  Cardiologist:  Deatrice Cage, MD   Electrophysiologist:  Fonda Kitty, MD  Electrophysiology APP:  Adoni Greenough, NP    Discussed the use of AI scribe software for clinical note transcription with the patient, who gave verbal consent to proceed.   Patient Profile    Chief Complaint: AFib, aflutter follow-up  History of Present Illness: Billy Carr is a 78 y.o. male with PMH notable for parox AFib, atypical flutter, atach, orthostatic intolerance, syncope; seen today for Fonda Kitty, MD for routine electrophysiology followup.   He is s/p AF ablation x 2 at [facility] both in 2015. He was loaded on tikosyn after the first ablation, and then tikosyn was stopped d/t no AFib in 2016.  He presented to [facility] 12/2023 after two syncopal events at home. EMS was called and he was found to be in SVT unresponsive to adenosine. EKG highly suggestive of aflutter. He was off OAC at the time. He had TEE/DCCV during hospitalization and was discharged with eliquis .   He saw Dr. Kitty in follow-up to discuss watchman procedure.   On follow-up today, he is overall feeling very well. He has not aware of any AFib or palpitation episodes. He is tolerating eliquis  very well without any bleeding concerns. He has not had any further syncopal episodes. He does still have balance concerns, thinks it may be meniere's disease or vertigo. He has talked to PCP about it, who said his symptoms are liekly age-related.   He is not interested in pursuing watchman procedure.     Arrhythmia/Device History Tikosyn - stopped d/t no AF    ROS:  Please see the history of present illness. All other systems are reviewed and otherwise negative.    Physical Exam    VS:  BP 118/72 (BP Location: Left Arm, Patient Position: Sitting, Cuff Size: Normal)   Pulse 66   Ht 6' 1  (1.854 m)   Wt 196 lb 12.8 oz (89.3 kg)   SpO2 98%   BMI 25.96 kg/m  BMI: Body mass index is 25.96 kg/m.      Wt Readings from Last 3 Encounters:  05/06/24 196 lb 12.8 oz (89.3 kg)  02/06/24 194 lb 3.2 oz (88.1 kg)  01/30/24 192 lb (87.1 kg)     GEN- The patient is well appearing, alert and oriented x 3 today.   Lungs- Clear to ausculation bilaterally, normal work of breathing.  Heart- Regular rate and rhythm, no murmurs, rubs or gallops Extremities- No peripheral edema, warm, dry    Studies Reviewed   Previous EP, cardiology notes.    EKG is ordered. Personal review of EKG from today shows:    EKG Interpretation Date/Time:  Monday May 06 2024 13:03:07 EDT Ventricular Rate:  66 PR Interval:  162 QRS Duration:  100 QT Interval:  402 QTC Calculation: 421 R Axis:   -53  Text Interpretation: Normal sinus rhythm with sinus arrhythmia Left axis deviation Confirmed by Larron Armor [phone]) on 05/06/2024 1:04:32 PM    TEE, 01/16/2024  1. Left ventricular ejection fraction, by estimation, is 50 to 55%. The left ventricle has low normal function.   2. Right ventricular systolic function is normal. The right ventricular size is normal.   3. No left atrial/left atrial appendage thrombus was detected.   4. The mitral valve is normal in structure. Mild  to moderate mitral valve regurgitation.   5. Tricuspid valve regurgitation is mild to moderate.   6. The aortic valve is tricuspid. Aortic valve regurgitation is not visualized. Aortic valve sclerosis/calcification is present, without any evidence of aortic stenosis.   7. 3d not performed.    TTE, 01/14/2024  1. Left ventricular ejection fraction, by estimation, is 50 to 55%. The left ventricle has low normal function. The left ventricle has no regional wall motion abnormalities. There is mild left ventricular hypertrophy. Left ventricular diastolic function  could not be evaluated.   2. Right ventricular systolic function is normal.  The right ventricular size is normal. Mildly increased right ventricular wall thickness. There is moderately elevated pulmonary artery systolic pressure.   3. The mitral valve is degenerative. Mild to moderate mitral valve regurgitation. Moderate mitral annular calcification.   4. Tricuspid valve regurgitation is moderate.   5. The aortic valve is tricuspid. There is mild calcification of the aortic valve. There is mild thickening of the aortic valve. Aortic valve regurgitation is not visualized. Aortic valve sclerosis/calcification is present, without any evidence of  aortic stenosis.   6. Aortic dilatation noted. There is borderline dilatation of the aortic root, measuring 38 mm. There is mild dilatation of the ascending aorta, measuring 39 mm.   7. The inferior vena cava is normal in size with greater than 50% respiratory variability, suggesting right atrial pressure of 3 mmHg.      Assessment and Plan     #) parox Afib #) atypical aflutter S/p AF ablation x 2 at [facility] in 2015 He is not aware of any further aflutter episodes No further syncope Continue 12.5mg  toprol  daily  #) Hypercoag d/t afib CHA2DS2-VASc Score = at least 2 [CHF History: 0, HTN History: 0, Diabetes History: 0, Stroke History: 0, Vascular Disease History: 0, Age Score: 2, Gender Score: 0].  Therefore, the patient's annual risk of stroke is 2.2 %.    Stroke ppx - 5mg  eliquis  BID, appropriately dosed No bleeding concerns  #) dizziness, ?vertigo Recommended patient re discuss with PCP and/or ENT evaluation       Current medicines are reviewed at length with the patient today.   The patient does not have concerns regarding his medicines.  The following changes were made today:  none  Labs/ tests ordered today include:  Orders Placed This Encounter  Procedures   EKG 12-Lead     Disposition: Follow up with Dr. Kennyth or EP APP in 6 months   Signed, Jakell Trusty, NP  05/06/24  1:18 PM   Electrophysiology [facility]

## 2024-05-06 ENCOUNTER — Ambulatory Visit: Attending: Cardiology | Admitting: Cardiology

## 2024-05-06 VITALS — BP 118/72 | HR 66 | Ht 73.0 in | Wt 196.8 lb

## 2024-05-06 DIAGNOSIS — I484 Atypical atrial flutter: Secondary | ICD-10-CM | POA: Diagnosis not present

## 2024-05-06 DIAGNOSIS — D6869 Other thrombophilia: Secondary | ICD-10-CM

## 2024-05-06 DIAGNOSIS — I480 Paroxysmal atrial fibrillation: Secondary | ICD-10-CM

## 2024-05-06 NOTE — Patient Instructions (Signed)
 Medication Instructions:  The current medical regimen is effective;  continue present plan and medications as directed. Please refer to the Current Medication list given to you today.   *If you need a refill on your cardiac medications before your next appointment, please call your pharmacy*  Follow-Up: At [facility], you and your health needs are our priority.  As part of our continuing mission to provide you with exceptional heart care, our providers are all part of one team.  This team includes your primary Cardiologist (physician) and Advanced Practice Providers or APPs (Physician Assistants and Nurse Practitioners) who all work together to provide you with the care you need, when you need it.  Your next appointment:   6 month(s)  Provider:   Nobie Putnam, MD or Sherie Don, NP    We recommend signing up for the patient portal called "MyChart".  Sign up information is provided on this After Visit Summary.  MyChart is used to connect with patients for Virtual Visits (Telemedicine).  Patients are able to view lab/test results, encounter notes, upcoming appointments, etc.  Non-urgent messages can be sent to your provider as well.   To learn more about what you can do with MyChart, go to [email or web address].

## 2024-05-10 MED ORDER — APIXABAN 5 MG PO TABS: 5.0000 mg | ORAL_TABLET | Freq: Two times a day (BID) | ORAL | 3 refills | Status: AC

## 2024-07-15 DIAGNOSIS — D2261 Melanocytic nevi of right upper limb, including shoulder: Secondary | ICD-10-CM | POA: Diagnosis not present

## 2024-07-15 DIAGNOSIS — Z85828 Personal history of other malignant neoplasm of skin: Secondary | ICD-10-CM | POA: Diagnosis not present

## 2024-07-15 DIAGNOSIS — D0439 Carcinoma in situ of skin of other parts of face: Secondary | ICD-10-CM | POA: Diagnosis not present

## 2024-07-15 DIAGNOSIS — L570 Actinic keratosis: Secondary | ICD-10-CM | POA: Diagnosis not present

## 2024-07-15 DIAGNOSIS — L2989 Other pruritus: Secondary | ICD-10-CM | POA: Diagnosis not present

## 2024-07-15 DIAGNOSIS — D225 Melanocytic nevi of trunk: Secondary | ICD-10-CM | POA: Diagnosis not present

## 2024-07-15 DIAGNOSIS — D0421 Carcinoma in situ of skin of right ear and external auricular canal: Secondary | ICD-10-CM | POA: Diagnosis not present

## 2024-07-15 DIAGNOSIS — L820 Inflamed seborrheic keratosis: Secondary | ICD-10-CM | POA: Diagnosis not present

## 2024-07-15 DIAGNOSIS — C44622 Squamous cell carcinoma of skin of right upper limb, including shoulder: Secondary | ICD-10-CM | POA: Diagnosis not present

## 2024-07-15 DIAGNOSIS — D2272 Melanocytic nevi of left lower limb, including hip: Secondary | ICD-10-CM | POA: Diagnosis not present

## 2024-07-15 DIAGNOSIS — D2262 Melanocytic nevi of left upper limb, including shoulder: Secondary | ICD-10-CM | POA: Diagnosis not present

## 2024-07-15 DIAGNOSIS — D485 Neoplasm of uncertain behavior of skin: Secondary | ICD-10-CM | POA: Diagnosis not present

## 2024-08-14 DIAGNOSIS — R42 Dizziness and giddiness: Secondary | ICD-10-CM | POA: Diagnosis not present

## 2024-08-15 ENCOUNTER — Other Ambulatory Visit: Payer: Self-pay | Admitting: Unknown Physician Specialty

## 2024-08-15 DIAGNOSIS — R42 Dizziness and giddiness: Secondary | ICD-10-CM

## 2024-08-22 ENCOUNTER — Ambulatory Visit
Admission: RE | Admit: 2024-08-22 | Discharge: 2024-08-22 | Disposition: A | Discharge status: Home / Self Care | Source: Ambulatory Visit | Attending: Unknown Physician Specialty | Admitting: Unknown Physician Specialty

## 2024-08-22 DIAGNOSIS — R42 Dizziness and giddiness: Secondary | ICD-10-CM

## 2024-08-22 DIAGNOSIS — I6523 Occlusion and stenosis of bilateral carotid arteries: Secondary | ICD-10-CM | POA: Diagnosis not present

## 2024-08-28 DIAGNOSIS — R2689 Other abnormalities of gait and mobility: Secondary | ICD-10-CM | POA: Diagnosis not present

## 2024-08-28 DIAGNOSIS — R42 Dizziness and giddiness: Secondary | ICD-10-CM | POA: Diagnosis not present

## 2024-09-04 DIAGNOSIS — R42 Dizziness and giddiness: Secondary | ICD-10-CM | POA: Diagnosis not present

## 2024-09-10 DIAGNOSIS — R42 Dizziness and giddiness: Secondary | ICD-10-CM | POA: Diagnosis not present

## 2024-09-10 DIAGNOSIS — R2681 Unsteadiness on feet: Secondary | ICD-10-CM | POA: Diagnosis not present

## 2024-09-11 DIAGNOSIS — R42 Dizziness and giddiness: Secondary | ICD-10-CM | POA: Diagnosis not present

## 2024-09-11 DIAGNOSIS — R2681 Unsteadiness on feet: Secondary | ICD-10-CM | POA: Diagnosis not present

## 2024-09-16 DIAGNOSIS — R42 Dizziness and giddiness: Secondary | ICD-10-CM | POA: Diagnosis not present

## 2024-09-16 DIAGNOSIS — R2681 Unsteadiness on feet: Secondary | ICD-10-CM | POA: Diagnosis not present

## 2024-09-19 DIAGNOSIS — R2681 Unsteadiness on feet: Secondary | ICD-10-CM | POA: Diagnosis not present

## 2024-09-19 DIAGNOSIS — R42 Dizziness and giddiness: Secondary | ICD-10-CM | POA: Diagnosis not present

## 2024-09-23 DIAGNOSIS — R42 Dizziness and giddiness: Secondary | ICD-10-CM | POA: Diagnosis not present

## 2024-09-23 DIAGNOSIS — R2681 Unsteadiness on feet: Secondary | ICD-10-CM | POA: Diagnosis not present

## 2024-09-27 DIAGNOSIS — R42 Dizziness and giddiness: Secondary | ICD-10-CM | POA: Diagnosis not present

## 2024-09-27 DIAGNOSIS — R2681 Unsteadiness on feet: Secondary | ICD-10-CM | POA: Diagnosis not present

## 2024-09-30 DIAGNOSIS — C44222 Squamous cell carcinoma of skin of right ear and external auricular canal: Secondary | ICD-10-CM | POA: Diagnosis not present

## 2024-10-23 ENCOUNTER — Other Ambulatory Visit: Payer: Self-pay | Admitting: Neurology

## 2024-10-23 DIAGNOSIS — Z9189 Other specified personal risk factors, not elsewhere classified: Secondary | ICD-10-CM

## 2024-10-23 DIAGNOSIS — R2689 Other abnormalities of gait and mobility: Secondary | ICD-10-CM

## 2024-10-23 DIAGNOSIS — G629 Polyneuropathy, unspecified: Secondary | ICD-10-CM

## 2024-10-26 ENCOUNTER — Ambulatory Visit
Admission: RE | Admit: 2024-10-26 | Discharge: 2024-10-26 | Disposition: A | Source: Ambulatory Visit | Attending: Neurology | Admitting: Neurology

## 2024-10-26 DIAGNOSIS — R2689 Other abnormalities of gait and mobility: Secondary | ICD-10-CM | POA: Diagnosis present

## 2024-10-26 DIAGNOSIS — G629 Polyneuropathy, unspecified: Secondary | ICD-10-CM | POA: Insufficient documentation

## 2024-10-26 DIAGNOSIS — Z9189 Other specified personal risk factors, not elsewhere classified: Secondary | ICD-10-CM | POA: Insufficient documentation

## 2024-10-30 ENCOUNTER — Ambulatory Visit: Attending: Cardiovascular Disease | Admitting: Cardiovascular Disease

## 2024-10-30 ENCOUNTER — Encounter: Payer: Self-pay | Admitting: Cardiovascular Disease

## 2024-10-30 VITALS — BP 110/60 | HR 64 | Ht 71.0 in | Wt 199.5 lb

## 2024-10-30 DIAGNOSIS — D6869 Other thrombophilia: Secondary | ICD-10-CM

## 2024-10-30 DIAGNOSIS — I480 Paroxysmal atrial fibrillation: Secondary | ICD-10-CM | POA: Diagnosis not present

## 2024-10-30 NOTE — Patient Instructions (Addendum)

## 2024-10-30 NOTE — Progress Notes (Signed)
 "    Cardiology Office Note   Date:  10/30/2024   ID:  Billy Carr, DOB [birth year], MRN [id number]  PCP:  Alla Amis, MD  Cardiologist:   Deatrice Cage, MD   Chief Complaint  Patient presents with   Follow-up    12 month f/u no complaints today. Meds reviewed verbally with pt.      History of Present Illness: Billy Carr is a 79 y.o. male who presents for a follow-up visit regarding persistent atrial fibrillation status post ablation in 2015 and 2016 at [facility]. He had a treadmill nuclear stress test in August 2017 which showed no evidence of ischemia with normal ejection fraction.  In 2018, he had an episode of syncope while in the recovery area after lithotripsy that was felt to be vasovagal.  Also he was bradycardic on metoprolol  which was discontinued at that time. Echocardiogram showed normal LV systolic function, mild mitral and tricuspid regurgitation and mild pulmonary hypertension.   He had palpitations in 2022 in the setting of taking the COVID-19 booster.  He had an outpatient monitor done which showed 7 runs of ventricular tachycardia and frequent short runs of SVT.  An echocardiogram was done then and showed normal LV systolic function with mild to moderate mitral regurgitation.  Treadmill nuclear stress test showed no evidence of ischemia.  He was able to exercise for 9 minutes and 30 seconds.  He was seen by Dr. Fernande who recommended a cardiac MRI which came back normal.  No intervention was needed.  He presented in March 2025 with syncope.  He was found to have an SVT by EMS unresponsive to adenosine.  EKG was highly suggestive of atrial flutter.  He had TEE guided cardioversion at that time.  He was seen by EP to discuss the Watchman device but he was not interested. He had no recurrent arrhythmia since March.  He denies chest pain or shortness of breath.  No issues with anticoagulation.  Past Medical History:  Diagnosis Date   Atrial flutter/fib 10/27/2011    flecainide ::CHADSVASc--1>> no anticoag 2/14   Atrial tachycardia    Bulging disc    cervical, no limitations   History of shingles    Nephrolithiasis    PVC (premature ventricular contraction)    asymptomatic   Syncope    Neurally mediated-presumed::also ?assoc with AFib   Vertical nystagmus    no recent issues   Wears dentures    full upper, partial lower    Past Surgical History:  Procedure Laterality Date   CARDIAC ELECTROPHYSIOLOGY STUDY AND ABLATION  2015, 2016   CARDIOVERSION N/A 01/16/2024   Procedure: CARDIOVERSION;  Surgeon: Darliss Rogue, MD;  Location: [facility];  Service: Cardiovascular;  Laterality: N/A;   COLONOSCOPY WITH PROPOFOL  N/A 10/03/2016   Procedure: COLONOSCOPY WITH PROPOFOL ;  Surgeon: Rogelia Copping, MD;  Location: [facility];  Service: Endoscopy;  Laterality: N/A;   EXTRACORPOREAL SHOCK WAVE LITHOTRIPSY Left 02/09/2017   Procedure: EXTRACORPOREAL SHOCK WAVE LITHOTRIPSY (ESWL);  Surgeon: Rosina Riis, MD;  Location: [facility];  Service: Urology;  Laterality: Left;   HERNIA REPAIR     bilateral   NEPHROSTOMY     open, left kidney stone   SQUAMOUS CELL CARCINOMA EXCISION     Head   TEE WITHOUT CARDIOVERSION N/A 01/16/2024   Procedure: ECHOCARDIOGRAM, TRANSESOPHAGEAL;  Surgeon: Darliss Rogue, MD;  Location: [facility];  Service: Cardiovascular;  Laterality: N/A;     Current Outpatient Medications  Medication Sig Dispense Refill  apixaban  (ELIQUIS ) 5 MG TABS tablet Take 1 tablet (5 mg total) by mouth 2 (two) times daily. 180 tablet 3   metoprolol  succinate (TOPROL -XL) 25 MG 24 hr tablet Take 0.5 tablets (12.5 mg total) by mouth daily. 60 tablet 2   Multiple Vitamin (MULTIVITAMIN) tablet Take 1 tablet by mouth daily.     Multiple Vitamins-Minerals (ZINC PO) Take by mouth daily.     vitamin C  (ASCORBIC ACID ) 500 MG tablet Take 500 mg by mouth daily.     VITAMIN D PO Take by mouth daily.     No current facility-administered medications for  this visit.    Allergies:   Patient has no known allergies.    Social History:  The patient  reports that he has never smoked. He has never used smokeless tobacco. He reports that he does not drink alcohol and does not use drugs.'s   Family History:  The patient's family history includes Heart failure in his mother.    ROS:  Please see the history of present illness.   Otherwise, review of systems are positive for none.   All other systems are reviewed and negative.    PHYSICAL EXAM: VS:  BP 110/60 (BP Location: Left Arm, Patient Position: Sitting, Cuff Size: Large)   Pulse 64   Ht 5' 11 (1.803 m)   Wt 199 lb 8 oz (90.5 kg)   SpO2 97%   BMI 27.82 kg/m  , BMI Body mass index is 27.82 kg/m. GEN: Well nourished, well developed, in no acute distress  HEENT: normal  Neck: no JVD, carotid bruits, or masses Cardiac: RRR with premature beats ; no murmurs, rubs, or gallops,no edema  Respiratory:  clear to auscultation bilaterally, normal work of breathing GI: soft, nontender, nondistended, + BS MS: no deformity or atrophy  Skin: warm and dry, no rash Neuro:  Strength and sensation are intact Psych: euthymic mood, full affect   EKG:  EKG is ordered today. The ekg ordered today demonstrates : Normal sinus rhythm with sinus arrhythmia Left anterior fascicular block When compared with ECG of 06-May-2024 13:03, No significant change was found    Recent Labs: 01/13/2024: ALT 15; B Natriuretic Peptide 275.2; Hemoglobin 12.9; Magnesium  1.8; Platelets 203 01/15/2024: BUN 24; Creatinine, Ser 0.90; Potassium 3.9; Sodium 139; TSH 0.899    Lipid Panel    Component Value Date/Time   CHOL 130 08/01/2012 0036   TRIG 77 08/01/2012 0036   HDL 34 (L) 08/01/2012 0036   CHOLHDL 4.9 12/14/2010 0334   VLDL 15 08/01/2012 0036   LDLCALC 81 08/01/2012 0036      Wt Readings from Last 3 Encounters:  10/30/24 199 lb 8 oz (90.5 kg)  05/06/24 196 lb 12.8 oz (89.3 kg)  02/06/24 194 lb 3.2 oz  (88.1 kg)           No data to display            ASSESSMENT AND PLAN:  1.  Paroxysmal atrial fibrillation status post ablation with subsequent atypical atrial flutter: No recurrent arrhythmia since March.  Given severity of his tachycardia and associated syncope, we have to monitor for recurrent symptoms.  If he develops recurrent episodes, he will require an antiarrhythmic medication or another ablation.  2.  Hypercoagulable state: Due to atrial fibrillation/flutter.  He is tolerating anticoagulation with Eliquis  with no issues.  We did discuss the Watchman device with Dr. Kennyth but decided not to pursue.   Disposition:   FU with me in 12  months  Signed,  Deatrice Cage, MD  10/30/2024 9:17 AM     [facility] "

## 2024-11-08 ENCOUNTER — Telehealth: Payer: Self-pay | Admitting: Cardiovascular Disease

## 2024-11-08 NOTE — Telephone Encounter (Signed)
" °*  STAT* If patient is at the pharmacy, call can be transferred to refill team.   1. Which medications need to be refilled? (please list name of each medication and dose if known) metoprolol  succinate (TOPROL -XL) 25 MG 24 hr tablet    2. Would you like to learn more about the convenience, safety, & potential cost savings by using the [facility]?    3. Are you open to using the [facility] (Type [facility].    4. Which pharmacy/location (including street and city if local pharmacy) is medication to be sent to?  [id number] - [place], [place] - [address]     5. Do they need a 30 day or 90 day supply? 90  Patient has one tablet left  "

## 2024-11-09 ENCOUNTER — Telehealth: Payer: Self-pay | Admitting: Physician Assistant

## 2024-11-09 DIAGNOSIS — I471 Supraventricular tachycardia, unspecified: Secondary | ICD-10-CM

## 2024-11-09 MED ORDER — METOPROLOL SUCCINATE ER 25 MG PO TB24: 12.5000 mg | ORAL_TABLET | Freq: Every day | ORAL | 3 refills | Status: AC

## 2024-11-09 NOTE — Telephone Encounter (Signed)
 Patient called the on-call service requesting refill of Toprol  XL 12.[facility] mg [facility] [place]. Refills were supplied for the year as patient was just seen in the office this month and doing well with recommendation to follow up in 12 months.

## 2024-11-11 NOTE — Progress Notes (Unsigned)
 "     Electrophysiology Clinic Note    Date:  11/11/2024  Patient ID:  Billy Carr, Billy Carr 09/23/1946, MRN [id number] PCP:  Alla Amis, MD  Cardiologist:  Deatrice Cage, MD   Electrophysiologist:  Fonda Kitty, MD  Electrophysiology APP:  Leslee Suire, NP    Discussed the use of AI scribe software for clinical note transcription with the patient, who gave verbal consent to proceed.   Patient Profile    Chief Complaint: AFib, aflutter follow-up  History of Present Illness: Billy Carr is a 79 y.o. male with PMH notable for parox AFib, atypical flutter, atach, orthostatic intolerance, syncope; seen today for Fonda Kitty, MD for routine electrophysiology followup.   He is s/p AF ablation x 2 at [facility] both in 2015. He was loaded on tikosyn after the first ablation, and then tikosyn was stopped d/t no AFib in 2016.  He presented to [facility] 12/2023 after two syncopal events at home. EMS was called and he was found to be in SVT unresponsive to adenosine. EKG highly suggestive of aflutter. He was off OAC at the time. He had TEE/DCCV during hospitalization and was discharged with eliquis .  He has discussed watchman procedure with Dr. Kitty and did not wish to proceed.   He recently saw Dr. Cage earlier this month where he had no had any further syncopal or SVT episodes.   Today,  ***      Arrhythmia/Device History Tikosyn - stopped d/t no AF    ROS:  Please see the history of present illness. All other systems are reviewed and otherwise negative.    Physical Exam    VS:  There were no vitals taken for this visit. BMI: There is no height or weight on file to calculate BMI.      Wt Readings from Last 3 Encounters:  10/30/24 199 lb 8 oz (90.5 kg)  05/06/24 196 lb 12.8 oz (89.3 kg)  02/06/24 194 lb 3.2 oz (88.1 kg)     GEN- The patient is well appearing, alert and oriented x 3 today.   Lungs- Clear to ausculation bilaterally, normal work of breathing.   Heart- Regular rate and rhythm, no murmurs, rubs or gallops Extremities- No peripheral edema, warm, dry    Studies Reviewed   Previous EP, cardiology notes.    EKG is ordered. Personal review of EKG from today shows:         TEE, 01/16/2024  1. Left ventricular ejection fraction, by estimation, is 50 to 55%. The left ventricle has low normal function.   2. Right ventricular systolic function is normal. The right ventricular size is normal.   3. No left atrial/left atrial appendage thrombus was detected.   4. The mitral valve is normal in structure. Mild to moderate mitral valve regurgitation.   5. Tricuspid valve regurgitation is mild to moderate.   6. The aortic valve is tricuspid. Aortic valve regurgitation is not visualized. Aortic valve sclerosis/calcification is present, without any evidence of aortic stenosis.   7. 3d not performed.    TTE, 01/14/2024  1. Left ventricular ejection fraction, by estimation, is 50 to 55%. The left ventricle has low normal function. The left ventricle has no regional wall motion abnormalities. There is mild left ventricular hypertrophy. Left ventricular diastolic function  could not be evaluated.   2. Right ventricular systolic function is normal. The right ventricular size is normal. Mildly increased right ventricular wall thickness. There is moderately elevated pulmonary artery systolic pressure.  3. The mitral valve is degenerative. Mild to moderate mitral valve regurgitation. Moderate mitral annular calcification.   4. Tricuspid valve regurgitation is moderate.   5. The aortic valve is tricuspid. There is mild calcification of the aortic valve. There is mild thickening of the aortic valve. Aortic valve regurgitation is not visualized. Aortic valve sclerosis/calcification is present, without any evidence of  aortic stenosis.   6. Aortic dilatation noted. There is borderline dilatation of the aortic root, measuring 38 mm. There is mild dilatation of  the ascending aorta, measuring 39 mm.   7. The inferior vena cava is normal in size with greater than 50% respiratory variability, suggesting right atrial pressure of 3 mmHg.      Assessment and Plan     #) parox Afib #) atypical aflutter S/p AF ablation x 2 at [facility] in 2015 He is not aware of any further aflutter episodes No further syncope Continue 12.5mg  toprol  daily  #) Hypercoag d/t afib CHA2DS2-VASc Score = at least 2 [CHF History: 0, HTN History: 0, Diabetes History: 0, Stroke History: 0, Vascular Disease History: 0, Age Score: 2, Gender Score: 0].  Therefore, the patient's annual risk of stroke is 2.2 %.    Stroke ppx - 5mg  eliquis  BID, appropriately dosed No bleeding concerns  #) dizziness, ?vertigo Recommended patient re discuss with PCP and/or ENT evaluation       Current medicines are reviewed at length with the patient today.   The patient does not have concerns regarding his medicines.  The following changes were made today:  none  Labs/ tests ordered today include:  No orders of the defined types were placed in this encounter.    Disposition: Follow up with Dr. Kennyth or EP APP in 6 months   Signed, Eagan Shifflett, NP  11/11/24  6:59 PM  Electrophysiology [facility] "

## 2024-11-12 ENCOUNTER — Ambulatory Visit: Admitting: Cardiology
